# Patient Record
Sex: Female | Born: 1980
Health system: Southern US, Community
[De-identification: ages and names within clinical notes are randomized; demographics above are authoritative.]

## PROBLEM LIST (undated history)

## (undated) DIAGNOSIS — F419 Anxiety disorder, unspecified: Secondary | ICD-10-CM

## (undated) DIAGNOSIS — D649 Anemia, unspecified: Secondary | ICD-10-CM

## (undated) DIAGNOSIS — Z87442 Personal history of urinary calculi: Secondary | ICD-10-CM

## (undated) DIAGNOSIS — I10 Essential (primary) hypertension: Secondary | ICD-10-CM

## (undated) DIAGNOSIS — L309 Dermatitis, unspecified: Secondary | ICD-10-CM

## (undated) HISTORY — PX: TONSILLECTOMY: SUR1361

## (undated) HISTORY — PX: OTHER SURGICAL HISTORY: SHX169

## (undated) HISTORY — DX: Dermatitis, unspecified: L30.9

---

## 2003-02-20 ENCOUNTER — Emergency Department (HOSPITAL_COMMUNITY): Admission: EM | Admit: 2003-02-20 | Discharge: 2003-02-20 | Payer: Self-pay | Admitting: Emergency Medicine

## 2003-05-31 ENCOUNTER — Ambulatory Visit (HOSPITAL_COMMUNITY): Admission: RE | Admit: 2003-05-31 | Discharge: 2003-05-31 | Payer: Self-pay | Admitting: General Surgery

## 2007-02-07 ENCOUNTER — Inpatient Hospital Stay (HOSPITAL_COMMUNITY): Admission: AD | Admit: 2007-02-07 | Discharge: 2007-02-07 | Payer: Self-pay | Admitting: Obstetrics & Gynecology

## 2007-04-22 ENCOUNTER — Encounter: Admission: RE | Admit: 2007-04-22 | Discharge: 2007-07-08 | Payer: Self-pay | Admitting: Obstetrics and Gynecology

## 2007-05-14 ENCOUNTER — Ambulatory Visit (HOSPITAL_COMMUNITY): Admission: RE | Admit: 2007-05-14 | Discharge: 2007-05-14 | Payer: Self-pay | Admitting: Obstetrics and Gynecology

## 2007-06-09 ENCOUNTER — Ambulatory Visit (HOSPITAL_COMMUNITY): Admission: RE | Admit: 2007-06-09 | Discharge: 2007-06-09 | Payer: Self-pay | Admitting: Obstetrics and Gynecology

## 2007-07-07 ENCOUNTER — Ambulatory Visit (HOSPITAL_COMMUNITY): Admission: RE | Admit: 2007-07-07 | Discharge: 2007-07-07 | Payer: Self-pay | Admitting: Obstetrics and Gynecology

## 2007-07-18 ENCOUNTER — Inpatient Hospital Stay (HOSPITAL_COMMUNITY): Admission: AD | Admit: 2007-07-18 | Discharge: 2007-07-18 | Payer: Self-pay | Admitting: Obstetrics

## 2007-07-20 ENCOUNTER — Inpatient Hospital Stay (HOSPITAL_COMMUNITY): Admission: AD | Admit: 2007-07-20 | Discharge: 2007-07-20 | Payer: Self-pay | Admitting: *Deleted

## 2007-07-21 ENCOUNTER — Ambulatory Visit (HOSPITAL_COMMUNITY): Admission: RE | Admit: 2007-07-21 | Discharge: 2007-07-21 | Payer: Self-pay | Admitting: Obstetrics and Gynecology

## 2007-08-12 ENCOUNTER — Ambulatory Visit (HOSPITAL_COMMUNITY): Admission: RE | Admit: 2007-08-12 | Discharge: 2007-08-12 | Payer: Self-pay | Admitting: Obstetrics and Gynecology

## 2007-08-26 ENCOUNTER — Ambulatory Visit (HOSPITAL_COMMUNITY): Admission: RE | Admit: 2007-08-26 | Discharge: 2007-08-26 | Payer: Self-pay | Admitting: Obstetrics and Gynecology

## 2007-09-02 ENCOUNTER — Ambulatory Visit (HOSPITAL_COMMUNITY): Admission: RE | Admit: 2007-09-02 | Discharge: 2007-09-02 | Payer: Self-pay | Admitting: Obstetrics and Gynecology

## 2007-09-09 ENCOUNTER — Inpatient Hospital Stay (HOSPITAL_COMMUNITY): Admission: AD | Admit: 2007-09-09 | Discharge: 2007-09-14 | Payer: Self-pay | Admitting: Obstetrics and Gynecology

## 2007-09-09 ENCOUNTER — Encounter: Payer: Self-pay | Admitting: Obstetrics and Gynecology

## 2007-09-11 ENCOUNTER — Encounter (INDEPENDENT_AMBULATORY_CARE_PROVIDER_SITE_OTHER): Payer: Self-pay | Admitting: Obstetrics and Gynecology

## 2007-09-15 ENCOUNTER — Encounter: Admission: RE | Admit: 2007-09-15 | Discharge: 2007-10-12 | Payer: Self-pay | Admitting: Obstetrics and Gynecology

## 2007-10-11 ENCOUNTER — Emergency Department (HOSPITAL_COMMUNITY): Admission: EM | Admit: 2007-10-11 | Discharge: 2007-10-11 | Payer: Self-pay | Admitting: Emergency Medicine

## 2007-10-13 ENCOUNTER — Encounter: Admission: RE | Admit: 2007-10-13 | Discharge: 2007-11-12 | Payer: Self-pay | Admitting: Obstetrics and Gynecology

## 2007-11-13 ENCOUNTER — Encounter: Admission: RE | Admit: 2007-11-13 | Discharge: 2007-12-08 | Payer: Self-pay | Admitting: Obstetrics and Gynecology

## 2008-03-16 IMAGING — US US FETAL BPP W/O NONSTRESS
1 series · 14 of 28 positions shown · non-contrast
Comparison: none

OBSTETRICAL ULTRASOUND:
 This ultrasound was performed in The [HOSPITAL], and the [facility] report will be stored to [REDACTED] PACS.

[Series 1: us fetal bpp w/o nonstress · 14 of 28 slices shown]
[im 2/28]
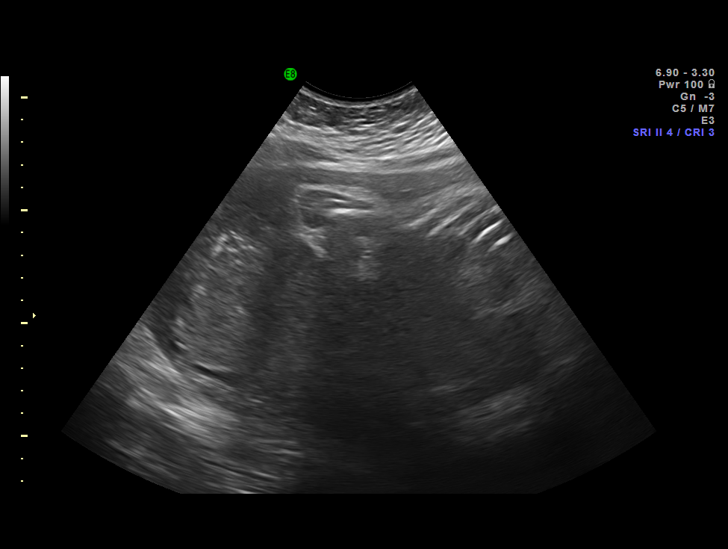
[im 4/28]
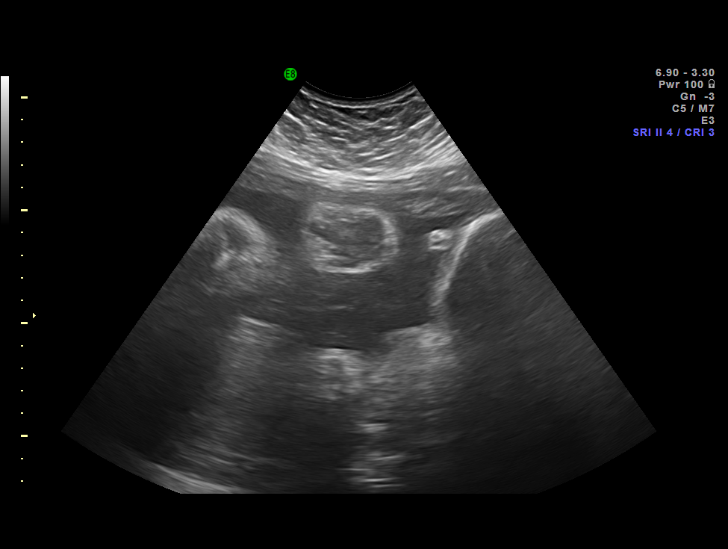
[im 6/28]
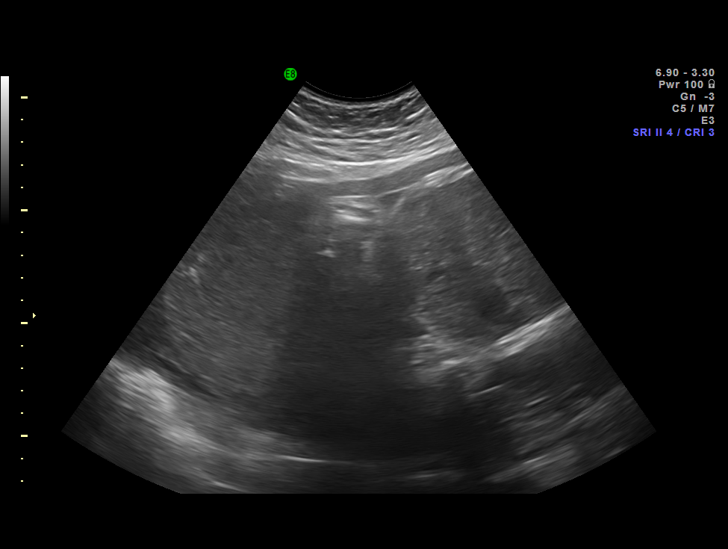
[im 8/28]
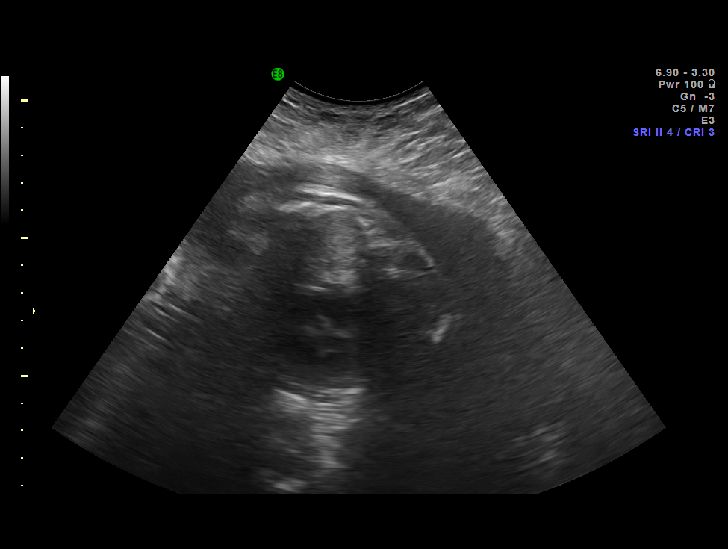
[im 10/28]
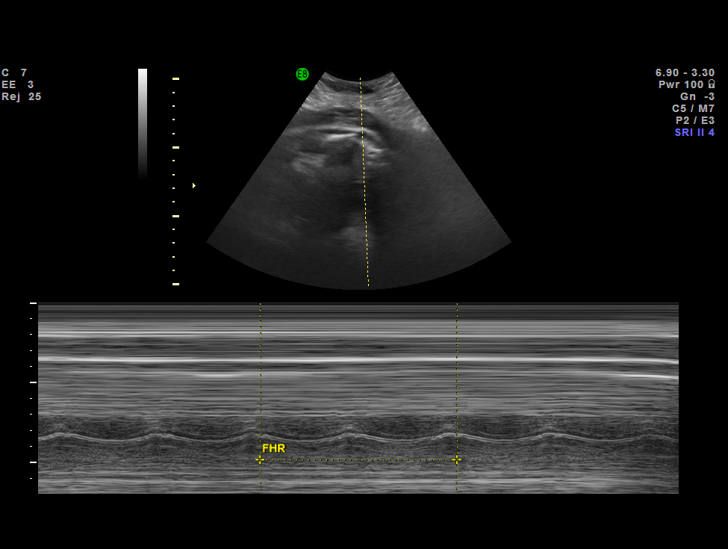
[im 12/28]
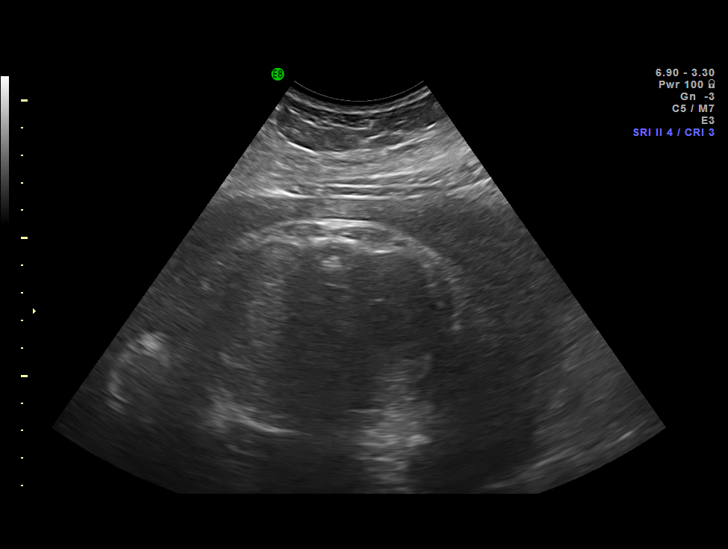
[im 14/28]
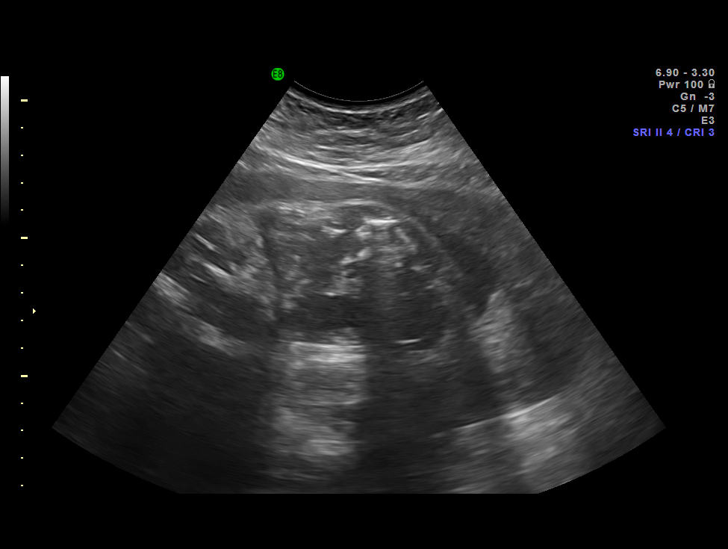
[im 16/28]
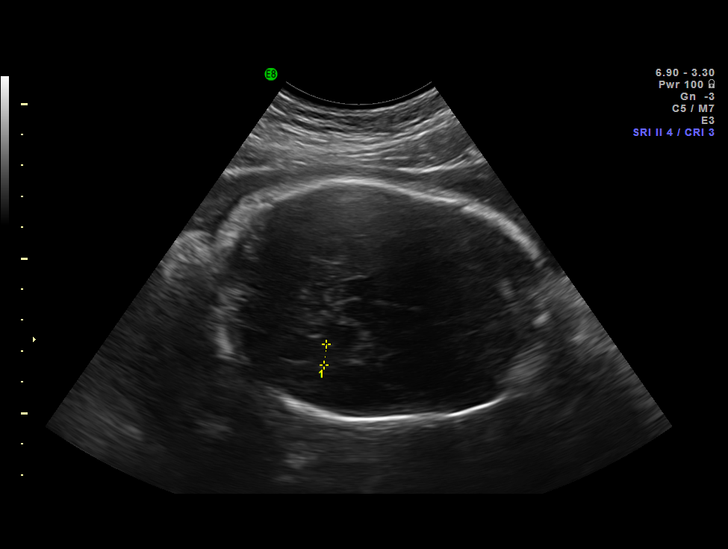
[im 18/28]
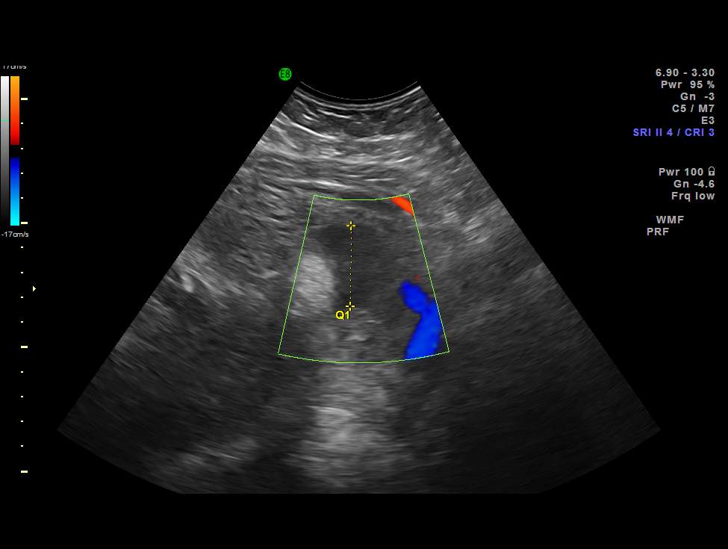
[im 20/28]
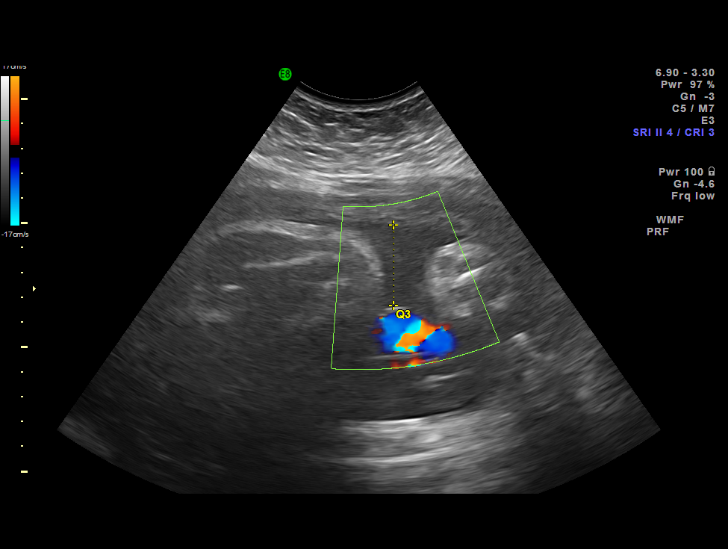
[im 22/28]
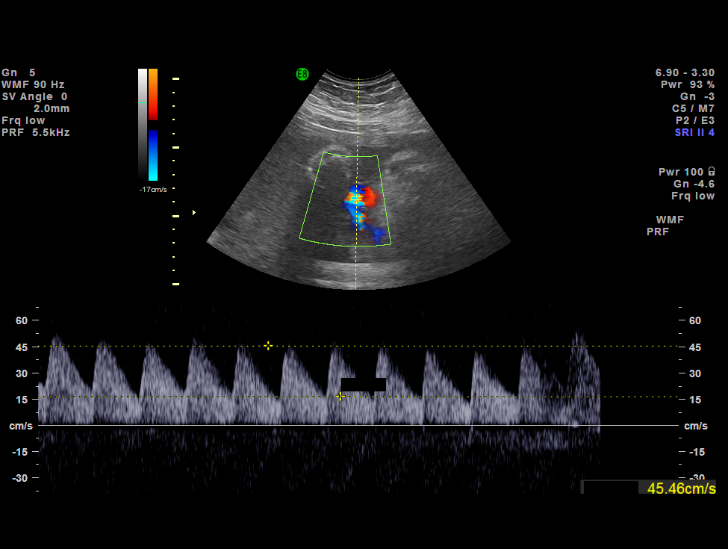
[im 24/28]
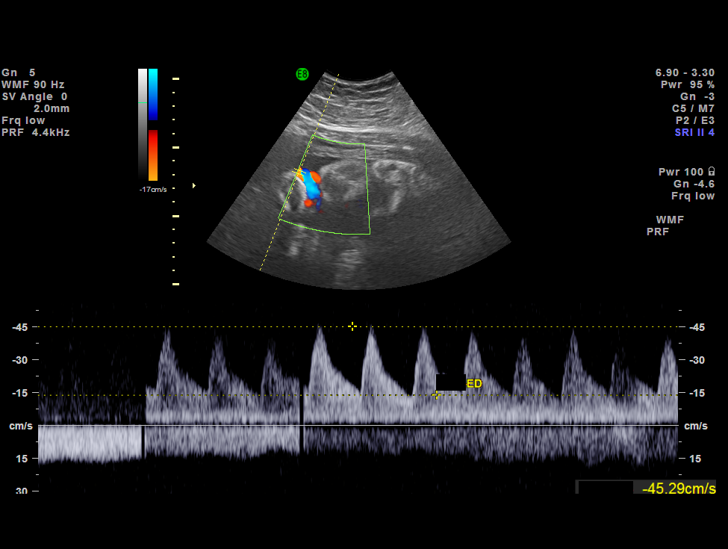
[im 26/28]
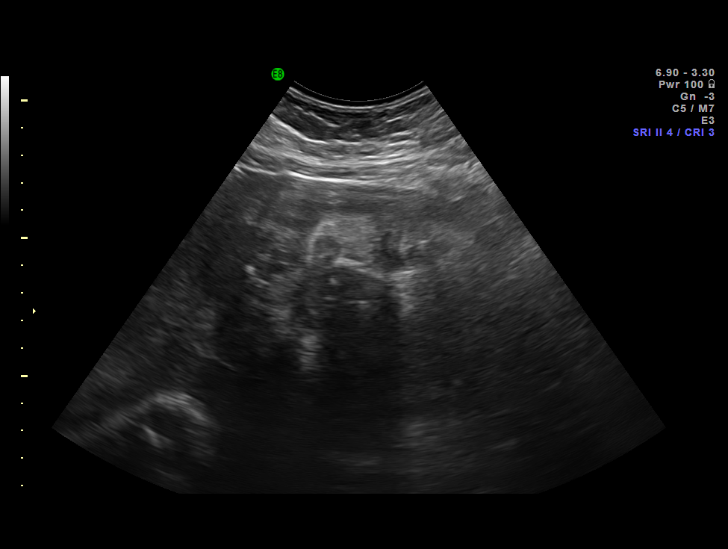
[im 28/28]
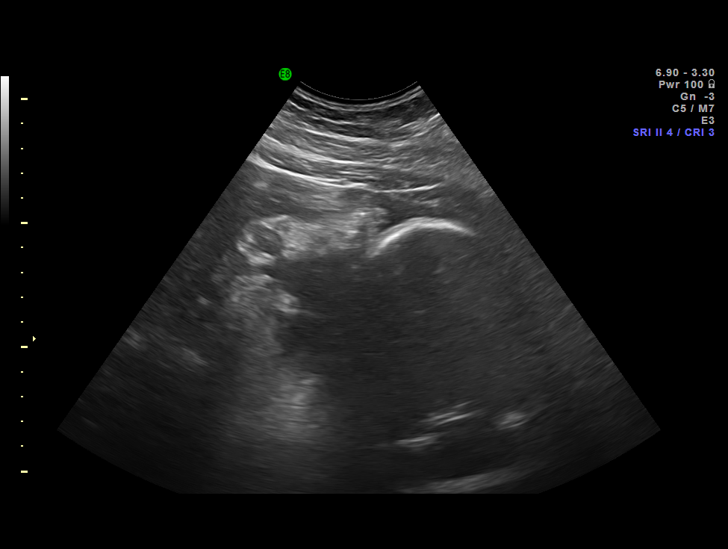

[14 of 28 positions shown; findings below may reference images not displayed]

IMPRESSION: The [facility] report has also been faxed to the ordering physician.

## 2008-07-29 ENCOUNTER — Emergency Department (HOSPITAL_COMMUNITY): Admission: EM | Admit: 2008-07-29 | Discharge: 2008-07-29 | Payer: Self-pay | Admitting: Emergency Medicine

## 2010-06-09 ENCOUNTER — Inpatient Hospital Stay (HOSPITAL_COMMUNITY): Admission: AD | Admit: 2010-06-09 | Discharge: 2010-06-09 | Payer: Self-pay | Admitting: Obstetrics and Gynecology

## 2010-07-10 ENCOUNTER — Inpatient Hospital Stay (HOSPITAL_COMMUNITY)
Admission: AD | Admit: 2010-07-10 | Discharge: 2010-07-11 | Payer: Self-pay | Source: Home / Self Care | Attending: Obstetrics and Gynecology | Admitting: Obstetrics and Gynecology

## 2010-07-16 ENCOUNTER — Inpatient Hospital Stay (HOSPITAL_COMMUNITY)
Admission: AD | Admit: 2010-07-16 | Discharge: 2010-07-19 | Payer: Self-pay | Source: Home / Self Care | Attending: Obstetrics and Gynecology | Admitting: Obstetrics and Gynecology

## 2010-10-08 LAB — COMPREHENSIVE METABOLIC PANEL
AST: 15 U/L (ref 0–37)
Albumin: 2.8 g/dL — ABNORMAL LOW (ref 3.5–5.2)
BUN: 7 mg/dL (ref 6–23)
BUN: 9 mg/dL (ref 6–23)
CO2: 22 mEq/L (ref 19–32)
CO2: 23 mEq/L (ref 19–32)
Chloride: 102 mEq/L (ref 96–112)
Chloride: 103 mEq/L (ref 96–112)
Creatinine, Ser: 0.64 mg/dL (ref 0.4–1.2)
Creatinine, Ser: 0.64 mg/dL (ref 0.4–1.2)
GFR calc non Af Amer: >60 mL/min (ref >60)
GFR calc non Af Amer: >60 mL/min (ref >60)
Glucose, Bld: 79 mg/dL (ref 70–99)
Total Bilirubin: 0.4 mg/dL (ref 0.3–1.2)
Total Bilirubin: 0.5 mg/dL (ref 0.3–1.2)

## 2010-10-08 LAB — CBC
Hemoglobin: 12 g/dL (ref 12.0–15.0)
MCH: 25 pg — ABNORMAL LOW (ref 26.0–34.0)
MCV: 77.7 fL — ABNORMAL LOW (ref 78.0–100.0)
MCV: 77.7 fL — ABNORMAL LOW (ref 78.0–100.0)
Platelets: 134 10*3/uL — ABNORMAL LOW (ref 150–400)
Platelets: 141 10*3/uL — ABNORMAL LOW (ref 150–400)
RBC: 4.84 MIL/uL (ref 3.87–5.11)
RBC: 5.11 MIL/uL (ref 3.87–5.11)
RDW: 14.8 % (ref 11.5–15.5)
WBC: 6.3 10*3/uL (ref 4.0–10.5)

## 2010-10-08 LAB — LACTATE DEHYDROGENASE: LDH: 159 U/L (ref 94–250)

## 2010-10-08 LAB — TYPE AND SCREEN: Antibody Screen: NEGATIVE

## 2010-10-08 LAB — RPR: RPR Ser Ql: NONREACTIVE

## 2010-10-08 LAB — URIC ACID: Uric Acid, Serum: 3.4 mg/dL (ref 2.4–7.0)

## 2010-10-09 LAB — RAPID URINE DRUG SCREEN, HOSP PERFORMED
Benzodiazepines: NOT DETECTED
Cocaine: NOT DETECTED
Opiates: NOT DETECTED

## 2010-10-09 LAB — URINE CULTURE: Colony Count: >=100000

## 2010-10-09 LAB — CBC
HCT: 38 % (ref 36.0–46.0)
MCV: 81.2 fL (ref 78.0–100.0)
RBC: 4.68 MIL/uL (ref 3.87–5.11)
WBC: 6.3 10*3/uL (ref 4.0–10.5)

## 2010-10-09 LAB — URINALYSIS, ROUTINE W REFLEX MICROSCOPIC
Glucose, UA: NEGATIVE mg/dL
Ketones, ur: 15 mg/dL — AB
Protein, ur: NEGATIVE mg/dL
Urobilinogen, UA: 0.2 mg/dL (ref 0.0–1.0)

## 2010-10-09 LAB — WET PREP, GENITAL

## 2010-10-09 LAB — STREP B DNA PROBE: Strep Group B Ag: NEGATIVE

## 2010-10-09 LAB — GC/CHLAMYDIA PROBE AMP, GENITAL: GC Probe Amp, Genital: NEGATIVE

## 2010-10-09 LAB — COMPREHENSIVE METABOLIC PANEL
Albumin: 2.8 g/dL — ABNORMAL LOW (ref 3.5–5.2)
Alkaline Phosphatase: 109 U/L (ref 39–117)
BUN: 13 mg/dL (ref 6–23)
Chloride: 104 mEq/L (ref 96–112)
Glucose, Bld: 96 mg/dL (ref 70–99)
Potassium: 4 mEq/L (ref 3.5–5.1)
Total Bilirubin: 0.2 mg/dL — ABNORMAL LOW (ref 0.3–1.2)

## 2010-10-09 LAB — URINE MICROSCOPIC-ADD ON

## 2010-10-09 LAB — LACTATE DEHYDROGENASE: LDH: 135 U/L (ref 94–250)

## 2010-12-11 NOTE — Op Note (Signed)
NAME:  Deborah Garza, Deborah Garza               ACCOUNT NO.:  [id number]   MEDICAL RECORD NO.:  [id number]          PATIENT TYPE:  INP   LOCATION:  9110                          FACILITY:  [facility]   PHYSICIAN:  Maxie Better, M.D.DATE OF BIRTH:  Feb 07, 1981   DATE OF PROCEDURE:  09/11/2007  DATE OF DISCHARGE:                               OPERATIVE REPORT   PREOPERATIVE DIAGNOSIS:  Nonreassuring fetal heart tracing, intrauterine  gestation at 37 plus weeks, intrauterine growth restriction, fibroid  uterus.   PROCEDURE:  Primary cesarean section with Sharl Ma hysterotomy.   POSTOPERATIVE DIAGNOSIS:  Nonreassuring fetal heart tracing secondary to  cord compression, intrauterine growth restriction, intrauterine  gestation at 37 plus weeks, fibroid uterus.   ANESTHESIA:  Epidural.   SURGEON:  Maxie Better, M.D.   ASSISTANT:  None.   INDICATIONS:  30 year old gravida 1, para 0, female admitted at 32 plus  weeks gestation for induction secondary to the intrauterine growth  restriction.  The patient had been followed by maternal fetal medicine  throughout her care and was found to have an estimated fetal weight of 4  pounds 14 ounces which was less than the 10th percentile.  She was group  B Strep culture positive.  The patient underwent Cervidil ripening,  Pitocin induction, artificial rupture of membranes, internal scalp  electrode, as well as intrauterine pressure catheters.  She ultimately  progressed to 4 cm, 100%, plus 1 station, and began having variable  decelerations which subsequently became persistent and bradycardic  resulting in the decision to proceed with a primary cesarean section as  the baby was not responsive to scalp stimulation and the amnionfusion  which had been started was not appearing to be effective.  The surgical  risks were reviewed.  The patient was transferred to the operating room  for primary cesarean section.   PROCEDURE IN DETAIL:  Under adequate epidural  anesthesia, the patient  was placed in the supine position with a left lateral tilt.  She was  sterilely prepped and draped in the usual fashion.  An indwelling Foley  catheter had been already in place.  Fetal heart rate on entering the  operating room had been 1202.  A Pfannenstiel skin incision was then  made and carried down to the rectus fascia.  The rectus fascia was  opened transversely.  The rectus fascia was then bluntly and sharply  dissected off the rectus muscle in a superior and inferior fashion.  The  rectus muscles were split in the midline.  The parietal peritoneum was  entered bluntly and extended. The vesicouterine peritoneum was opened  transversely. The bladder was displaced inferiorly.  A curvilinear low  transverse uterine incision was then made and extended with bandage  scissors.  Subsequent delivery of a live female from the left occiput  posterior position was accomplished.  The baby had a cord around the  neck and around the left arm.  The baby was subsequently bulb suctioned  on the abdomen, the cord was had been disentangled, clamped, cut and the  baby was transferred to the awaiting pediatrician who assigned Apgars of  8  and 9 at 1 and 5 minutes.  The placenta was manually removed and sent  to pathology.  The uterine cavity was cleaned of debris.  The uterine  incision had no extension and was closed in two layers, the first layer  a 0 Monocryl stitch, the second layer was imbricated using 0 Monocryl  sutures.  The serosal surface defect superior to the incision was closed  with 3-0 Monocryl suture. Normal tubes and ovaries were noted  bilaterally.  Large subserosal fibroids were noticed on both sides.  They were not removed.  The abdomen was then copiously irrigated and  suctioned of debris. The parietal peritoneum was then closed with 2-0  Vicryl, the rectus fascia was closed with 0 Vicryl x2, the subcutaneous  areas were irrigated, small bleeders cauterized,  and interrupted 2-0  plain sutures were placed, and the skin approximated with Ethicon  staples.  A cord pH was obtained but the quantity was not sufficient for  evaluation.   ESTIMATED BLOOD LOSS:  800 mL.   INTRAOPERATIVE FLUIDS:  1800 mL crystalloid.   URINE OUTPUT:  300 mL clear yellow urine.   COUNTS:  Sponge and instrument counts x2 was correct.   COMPLICATION:  None.   Weight of the baby was 4 pounds 14 ounces.  The baby was taken to the  central nursery. The mother, who was in stable condition, was  transferred to the recovery room.      Maxie Better, M.D.  Electronically Signed     Jasper/MEDQ  D:  09/11/2007  T:  09/12/2007  Job:  [id number]

## 2010-12-11 NOTE — Discharge Summary (Signed)
NAMEUNA, YEOMANS               ACCOUNT NO.:  [id number]   MEDICAL RECORD NO.:  [id number]          PATIENT TYPE:  INP   LOCATION:  9110                          FACILITY:  [facility]   PHYSICIAN:  Maxie Better, M.D.DATE OF BIRTH:  July 06, 1981   DATE OF ADMISSION:  09/09/2007  DATE OF DISCHARGE:  09/14/2007                               DISCHARGE SUMMARY   ADMISSION DIAGNOSES:  1. Intrauterine growth restriction.  2. Intrauterine gestation at 37+ weeks.  3. Uterine fibroids.   DISCHARGE DIAGNOSES:  1. Intrauterine gestation at 37+ weeks, delivered.  2. Intrauterine growth restriction.  3. Nonreassuring fetal heart tracing secondary to cord compression.  4. Fibroid uterus.   PROCEDURE:  A primary cesarean section, Kerr hysterotomy.   HISTORY OF PRESENT ILLNESS:  A 30 year old gravida 1, para 0, female at  60+ weeks' gestation admitted for induction of labor secondary to  intrauterine growth restriction.  The patient had been followed by  maternal-fetal medicine during her prenatal course.  She had an  ultrasound that showed an estimated fetal weight of 4 pounds 14 ounces,  which was less than the 10th percentile.  Biophysical profile was 8/8  and normal fluid.  Recommendation by maternal-fetal medicine was to  proceed with induction of labor.  The cervical exam was 1, 50%, -2.  The  .patient was known to be group B strep culture positive   HOSPITAL COURSE:  The patient was admitted to [facility].  The  cervix was 1,50%, -2.  She had a reactive tracing, no contractions.  Cervidil was placed for further ripening of her cervix.  The patient  subsequently had the Cervidil removed.  Pitocin induction was started.  Group B penicillin prophylaxis was started due to her group B strep  being positive.  The patient subsequently had artificial rupture of  membranes, clear fluid noted.  Internal pressure catheter, internal  scalp electrodes were placed.  She began having variable  decelerations  down to the 100's x30-40 seconds, baseline fetal heart rate of 150.  The  tracing resulted in a decision to do the artificial rupture of  membranes.  She was contracting every 1 to 1-1/2 minutes.  Pitocin was  decreased.  The patient continued to have variable decelerations.  She  had an episode of tetanic contraction.  Pitocin was discontinued.  A  fluid bolus was given and maternal oxygenation was given.  The tracing  became reassuring again and Pitocin was restarted.  Amnioinfusion was  also started for variable decelerations.  The fetal heart rate had  subsequently decreased on February 13 to the 70s.  She was 4, 100%, +1  station.  No scalp stimulation response was noted.  Therefore, a  decision was made to proceed with a primary cesarean section.  Pitocin  had been off, maternal oxygenation and positional changes had already  been done.  The patient was taken to the operating room.  A primary  cesarean section was performed.  The baby was a live female, Apgars of 8  and 9.  There was a cord around his neck and left arm and he  was in the  left occiput posterior position.  Insufficient specimens obtained for  cord pH; therefore, it was not done.  Normal tubes and ovaries were  noted.  A fundal subserosal fibroid was seen.  The placenta was sent to  pathology given the indication for her delivery.  It was a premature  placenta but otherwise unremarkable.  The patient had an uncomplicated  postoperative course.  Her CBC on postop day #1 showed a hemoglobin 9.4,  hematocrit of 28.1, platelet count 129,000, white count of 8.8.  By  postop day #3 the patient, who had a nonproductive cough, was otherwise  afebrile.  Lungs were clear.  She had no evidence of infection and had  tolerated a regular diet.  The incision had no evidence of erythema,  induration or exudate.  She was deemed well to be discharged home.   DISPOSITION:  Home.   CONDITION:  Stable.   DISCHARGE  MEDICATIONS:  1. Tessalon Perles 100 mg p.o. t.i.d. for 10 days.  2. Motrin 800 mg one p.o. q.6 h. p.r.n. pain.  3. Prenatal vitamins one p.o. daily.  4. Iron supplementation 325 mg p.o. b.i.d.  5. Stool softener 100 mg p.o. b.i.d.   FOLLOW-UP APPOINTMENT:  At [facility] at 6 weeks and for staple  removal on September 16, 2007.   Discharge instructions per the postpartum booklet given.      Maxie Better, M.D.  Electronically Signed     [place]/MEDQ  D:  10/05/2007  T:  10/06/2007  Job:  [id number]

## 2010-12-14 NOTE — H&P (Signed)
   NAME:  Deborah Garza, Deborah Garza                         ACCOUNT NO.:  [id number]   MEDICAL RECORD NO.:  [id number]                   PATIENT TYPE:   LOCATION:                                       FACILITY:  [facility]   PHYSICIAN:  Dirk Dress. Katrinka Blazing, M.D.                DATE OF BIRTH:  21-Sep-1980   DATE OF ADMISSION:  DATE OF DISCHARGE:                                HISTORY & PHYSICAL   HISTORY OF PRESENT ILLNESS:  [age]-year-old female with a history of  swelling in the perianal area for about four days prior to admission.  She  had purulent drainage x2 days.  The pain became much worse and she was seen  in the office, where she was noted to have a perirectal abscess.  She is  scheduled to have this excised or incised and drained.   PAST HISTORY:  She has obesity and mild hypertension.  She has no other  medical illness.   MEDICATIONS:  Hydrochlorothiazide 25 mg daily.   PHYSICAL EXAMINATION:  VITAL SIGNS:  On exam, blood pressure 124/78, pulse  80, respirations 20, weight 250 pounds.  HEENT:  Unremarkable.  NECK:  Neck is supple without JVD or bruit.  CHEST:  Chest clear to auscultation.  HEART:  Regular rate and rhythm without murmur, gallop or rub.  ABDOMEN:  Abdomen is soft, nontender.  No masses.  RECTAL:  Indurated area of the right perirectal space with small puncture  site which is not draining.  This extends into the anal canal.  A good  digital examination could not be done because of pain.  There is surrounding  induration.  EXTREMITIES:  No cyanosis, clubbing or edema.  NEUROLOGIC:  No focal motor, sensory or cerebellar deficit.   IMPRESSION:  1. Perirectal abscess.  2. Mild hypertension.   PLAN:  Scheduled for excision or incision and drainage of perirectal  abscess.     ___________________________________________                                         Dirk Dress Katrinka Blazing, M.D.   LCS/MEDQ  D:  05/31/2003  T:  05/31/2003  Job:  [id number]

## 2011-04-19 ENCOUNTER — Emergency Department (HOSPITAL_COMMUNITY)
Admission: EM | Admit: 2011-04-19 | Discharge: 2011-04-19 | Disposition: A | Discharge status: Home / Self Care | Payer: BC Managed Care – PPO | Attending: Emergency Medicine | Admitting: Emergency Medicine

## 2011-04-19 DIAGNOSIS — M79609 Pain in unspecified limb: Secondary | ICD-10-CM | POA: Insufficient documentation

## 2011-04-19 DIAGNOSIS — I10 Essential (primary) hypertension: Secondary | ICD-10-CM | POA: Insufficient documentation

## 2011-04-19 DIAGNOSIS — M5430 Sciatica, unspecified side: Secondary | ICD-10-CM | POA: Insufficient documentation

## 2011-04-19 DIAGNOSIS — IMO0001 Reserved for inherently not codable concepts without codable children: Secondary | ICD-10-CM | POA: Insufficient documentation

## 2011-04-19 DIAGNOSIS — M545 Low back pain, unspecified: Secondary | ICD-10-CM | POA: Insufficient documentation

## 2011-04-19 LAB — CBC
HCT: 28.1 — ABNORMAL LOW
Hemoglobin: 12.2
Hemoglobin: 9.4 — ABNORMAL LOW
MCV: 79.6
MCV: 78.6
RBC: 4.69
RDW: 16.2 — ABNORMAL HIGH
WBC: 5.9

## 2011-04-19 LAB — URINE MICROSCOPIC-ADD ON

## 2011-04-19 LAB — URINALYSIS, ROUTINE W REFLEX MICROSCOPIC
Bilirubin Urine: NEGATIVE
Ketones, ur: NEGATIVE mg/dL
Nitrite: NEGATIVE
Protein, ur: NEGATIVE mg/dL
Urobilinogen, UA: 1 mg/dL (ref 0.0–1.0)
pH: 6.5 (ref 5.0–8.0)

## 2011-04-19 LAB — POCT PREGNANCY, URINE: Preg Test, Ur: NEGATIVE

## 2011-04-19 LAB — RPR: RPR Ser Ql: NONREACTIVE

## 2011-05-03 LAB — URINALYSIS, ROUTINE W REFLEX MICROSCOPIC
Bilirubin Urine: NEGATIVE
Hgb urine dipstick: NEGATIVE
Specific Gravity, Urine: 1.02
Urobilinogen, UA: 0.2
pH: 7

## 2011-05-14 LAB — URINALYSIS, ROUTINE W REFLEX MICROSCOPIC
Glucose, UA: NEGATIVE
pH: 6

## 2011-11-06 ENCOUNTER — Telehealth: Payer: Self-pay

## 2011-11-06 ENCOUNTER — Other Ambulatory Visit: Payer: Self-pay

## 2011-11-06 NOTE — Telephone Encounter (Signed)
Spoke with pt rgd msg pt has appt for aex 12/05/11 want rx for bc pt states not taking bc x77months advised pt unable to refil rx at this time per protocol will get new rx at next visit pt voice understanding. Rolla Plate

## 2011-12-04 ENCOUNTER — Ambulatory Visit (INDEPENDENT_AMBULATORY_CARE_PROVIDER_SITE_OTHER): Payer: BC Managed Care – PPO | Admitting: Obstetrics and Gynecology

## 2011-12-04 ENCOUNTER — Encounter: Payer: Self-pay | Admitting: Obstetrics and Gynecology

## 2011-12-04 VITALS — BP 138/98 | Resp 16 | Ht 65.5 in | Wt 270.0 lb

## 2011-12-04 DIAGNOSIS — Z124 Encounter for screening for malignant neoplasm of cervix: Secondary | ICD-10-CM

## 2011-12-04 DIAGNOSIS — Z309 Encounter for contraceptive management, unspecified: Secondary | ICD-10-CM

## 2011-12-04 DIAGNOSIS — D219 Benign neoplasm of connective and other soft tissue, unspecified: Secondary | ICD-10-CM

## 2011-12-04 DIAGNOSIS — E669 Obesity, unspecified: Secondary | ICD-10-CM

## 2011-12-04 DIAGNOSIS — Z139 Encounter for screening, unspecified: Secondary | ICD-10-CM

## 2011-12-04 DIAGNOSIS — Z01419 Encounter for gynecological examination (general) (routine) without abnormal findings: Secondary | ICD-10-CM

## 2011-12-04 DIAGNOSIS — D259 Leiomyoma of uterus, unspecified: Secondary | ICD-10-CM

## 2011-12-04 NOTE — Patient Instructions (Signed)
Please give Mirena pamphlet.

## 2011-12-04 NOTE — Progress Notes (Signed)
Contraception condoms Last pap 2011 Last Mammo never Last Colonoscopy never Last Dexa Scan never Primary MD Dr. Mirna Mires Abuse: No  No complaints.  Wants BC method.  Used to be on progesterone only pills.  Filed Vitals:   12/04/11 1539  BP: 138/98  Resp: 16   ROS: noncontributory  Physical Examination: General appearance - alert, well appearing, and in no distress Neck - supple, no significant adenopathy Chest - clear to auscultation, no wheezes, rales or rhonchi, symmetric air entry Heart - normal rate and regular rhythm Abdomen - soft, nontender, nondistended, no masses or organomegaly Breasts - breasts appear normal, no suspicious masses, no skin or nipple changes or axillary nodes Pelvic - normal external genitalia, vulva, vagina, cervix, uterus 12wk size and adnexa, +menses Back exam - no CVAT Extremities - no edema, redness or tenderness in the calves or thighs  A/P Labs - TSH, PRL, CBC, vit D Pap GC/CT pre IUD Counseled re: contraception - plans Mirena in 4wks  U/S in 4wks to reeval fibroids

## 2011-12-05 LAB — CBC
HCT: 42.1 % (ref 36.0–46.0)
MCHC: 30.2 g/dL (ref 30.0–36.0)
MCV: 79 fL (ref 78.0–100.0)
RDW: 14.8 % (ref 11.5–15.5)

## 2011-12-06 ENCOUNTER — Telehealth: Payer: Self-pay | Admitting: Obstetrics and Gynecology

## 2011-12-06 LAB — PAP IG, CT-NG, RFX HPV ASCU: Chlamydia Probe Amp: NEGATIVE

## 2011-12-06 NOTE — Telephone Encounter (Signed)
Jackie/epic/lab res

## 2011-12-09 ENCOUNTER — Emergency Department (HOSPITAL_COMMUNITY)
Admission: EM | Admit: 2011-12-09 | Discharge: 2011-12-10 | Disposition: A | Discharge status: Home / Self Care | Payer: BC Managed Care – PPO | Attending: Emergency Medicine | Admitting: Emergency Medicine

## 2011-12-09 ENCOUNTER — Encounter (HOSPITAL_COMMUNITY): Payer: Self-pay | Admitting: *Deleted

## 2011-12-09 DIAGNOSIS — I10 Essential (primary) hypertension: Secondary | ICD-10-CM | POA: Insufficient documentation

## 2011-12-09 DIAGNOSIS — R079 Chest pain, unspecified: Secondary | ICD-10-CM | POA: Insufficient documentation

## 2011-12-09 HISTORY — DX: Essential (primary) hypertension: I10

## 2011-12-09 NOTE — Telephone Encounter (Signed)
This is AR pt 

## 2011-12-09 NOTE — ED Provider Notes (Signed)
History  This chart was scribed for [employer]. Colon Branch, MD by Cherlynn Perches. The patient was seen in room APA11/APA11. Patient's care was started at 2241.  CSN: [id number]  Arrival date & time 12/09/11  2241   First MD Initiated Contact with Patient 12/09/11 2311      Chief Complaint  Patient presents with  . Chest Pain    (Consider location/radiation/quality/duration/timing/severity/associated sxs/prior treatment) HPI  Deborah Garza is a 31 y.o. female with a h/o HTN who presents to the Emergency Department complaining of 1 day of sudden onset, unchanged, intermittent chest pain localized to the left chest and described as pressure. Pt states that symptoms began after eating a piece of pizza for dinner. Pt reports that pain is relieved by burping, but always comes back. Pt states that breathing deeply makes pain worse. Pt denies nausea, diaphoresis, and SOB. Pt denies smoking and reports using alcohol.   Past Medical History  Diagnosis Date  . Hypertension     Past Surgical History  Procedure Date  . Cesarean section   . Tonsillectomy     History reviewed. No pertinent family history.  History  Substance Use Topics  . Smoking status: Never Smoker   . Smokeless tobacco: Not on file  . Alcohol Use: Yes    OB History    Grav Para Term Preterm Abortions TAB SAB Ect Mult Living                  Review of Systems  Constitutional: Negative for chills, diaphoresis and fatigue.  HENT: Negative for congestion, sinus pressure and ear discharge.   Eyes: Negative for discharge.  Respiratory: Negative for cough and shortness of breath.   Cardiovascular: Positive for chest pain.  Gastrointestinal: Negative for nausea, abdominal pain and diarrhea.  Genitourinary: Negative for frequency and hematuria.  Musculoskeletal: Negative for back pain.  Skin: Negative for rash.  Neurological: Negative for seizures and headaches.  Hematological: Negative.   Psychiatric/Behavioral:  Negative for hallucinations.  All other systems reviewed and are negative.    Allergies  Aspirin  Home Medications   Current Outpatient Rx  Name Route Sig Dispense Refill  . HYDROCHLOROTHIAZIDE 25 MG PO TABS Oral Take 25 mg by mouth daily.      Triage Vitals: BP 165/95  Pulse 78  Temp(Src) 98.1 F (36.7 C) (Oral)  Resp 20  Ht 5\' 6"  (1.676 m)  Wt 260 lb (117.935 kg)  BMI 41.97 kg/m2  SpO2 100%  LMP 12/03/2011  Physical Exam  Nursing note and vitals reviewed. Constitutional: She is oriented to person, place, and time. She appears well-developed and well-nourished.  HENT:  Head: Normocephalic and atraumatic.  Eyes: Conjunctivae and EOM are normal. Pupils are equal, round, and reactive to light. No scleral icterus.  Neck: Normal range of motion. Neck supple.  Cardiovascular: Normal rate, regular rhythm and normal heart sounds.  Exam reveals no gallop and no friction rub.   No murmur heard. Pulmonary/Chest: Effort normal and breath sounds normal. No respiratory distress. She exhibits no tenderness (tenderness not reproducable).  Abdominal: Soft. Bowel sounds are normal. She exhibits no distension. There is no tenderness.  Musculoskeletal: Normal range of motion. She exhibits no edema.  Neurological: She is alert and oriented to person, place, and time. Coordination normal.  Skin: Skin is warm and dry.  Psychiatric: She has a normal mood and affect. Her behavior is normal.    ED Course  Procedures (including critical care time)  DIAGNOSTIC STUDIES: Oxygen Saturation  is 100% on room air, normal by my interpretation.    COORDINATION OF CARE: 11:56PM - discussed labs and EKG with pt. She agrees with plan. Results for orders placed during the hospital encounter of 12/09/11  CBC      Component Value Range   WBC 6.2  4.0 - 10.5 (K/uL)   RBC 5.08  3.87 - 5.11 (MIL/uL)   Hemoglobin 12.4  12.0 - 15.0 (g/dL)   HCT 16.1  09.6 - 04.5 (%)   MCV 77.0 (*) 78.0 - 100.0 (fL)   MCH  24.4 (*) 26.0 - 34.0 (pg)   MCHC 31.7  30.0 - 36.0 (g/dL)   RDW 40.9  81.1 - 91.4 (%)   Platelets 202  150 - 400 (K/uL)  BASIC METABOLIC PANEL      Component Value Range   Sodium 137  135 - 145 (mEq/L)   Potassium 3.5  3.5 - 5.1 (mEq/L)   Chloride 102  96 - 112 (mEq/L)   CO2 25  19 - 32 (mEq/L)   Glucose, Bld 113 (*) 70 - 99 (mg/dL)   BUN 15  6 - 23 (mg/dL)   Creatinine, Ser 7.82  0.50 - 1.10 (mg/dL)   Calcium 8.9  8.4 - 95.6 (mg/dL)   GFR calc non Af Amer >90  >90 (mL/min)   GFR calc Af Amer >90  >90 (mL/min)  TROPONIN I      Component Value Range   Troponin I <0.30  <0.30 (ng/mL)  URINALYSIS, ROUTINE W REFLEX MICROSCOPIC      Component Value Range   Color, Urine YELLOW  YELLOW    APPearance CLEAR  CLEAR    Specific Gravity, Urine 1.025  1.005 - 1.030    pH 6.0  5.0 - 8.0    Glucose, UA NEGATIVE  NEGATIVE (mg/dL)   Hgb urine dipstick NEGATIVE  NEGATIVE    Bilirubin Urine NEGATIVE  NEGATIVE    Ketones, ur NEGATIVE  NEGATIVE (mg/dL)   Protein, ur NEGATIVE  NEGATIVE (mg/dL)   Urobilinogen, UA 0.2  0.0 - 1.0 (mg/dL)   Nitrite NEGATIVE  NEGATIVE    Leukocytes, UA NEGATIVE  NEGATIVE   Dg Chest 2 View  12/10/2011  *RADIOLOGY REPORT*  Clinical Data: Chest pain since yesterday.  CHEST - 2 VIEW  Comparison: None.  Findings: Mild cardiac enlargement with normal pulmonary vascularity.  No focal airspace consolidation in the lungs.  No blunting of costophrenic angles.  No pneumothorax.  IMPRESSION: Mild cardiac enlargement.  No evidence of active pulmonary disease.  Original Report Authenticated By: Marlon Pel, M.D.    . Date: 12/10/2011  2301  Rate: 68  Rhythm: normal sinus rhythm  QRS Axis: normal  Intervals: normal  ST/T Wave abnormalities: normal  Conduction Disutrbances: none  Narrative Interpretation: unremarkable       MDM  Patient with left intermittent chest pain onset yesterday after eating pizza. She's had no response to TUMS. Given mouth PPI and H2 blocker  while here with relief. Labs are unremarkable ,chest x-ray is normal, EKG is normal.  Pt feels improved after observation and/or treatment in ED.Pt stable in ED with no significant deterioration in condition.The patient appears reasonably screened and/or stabilized for discharge and I doubt any other medical condition or other [facility] requiring further screening, evaluation, or treatment in the ED at this time prior to discharge.  I personally performed the services described in this documentation, which was scribed in my presence. The recorded information has been reviewed and considered.  MDM Reviewed: nursing note and vitals Interpretation: labs, ECG and x-ray           Nicoletta Dress. Colon Branch, MD 12/10/11 [phone]

## 2011-12-09 NOTE — Telephone Encounter (Signed)
TRIAGE/EPIC °

## 2011-12-09 NOTE — ED Notes (Signed)
Lt chest pain intermittently, , onset yesterday after eating pizza,  "pressure" no nausea, no sob,

## 2011-12-10 ENCOUNTER — Telehealth: Payer: Self-pay

## 2011-12-10 ENCOUNTER — Emergency Department (HOSPITAL_COMMUNITY): Payer: BC Managed Care – PPO

## 2011-12-10 LAB — URINALYSIS, ROUTINE W REFLEX MICROSCOPIC
Bilirubin Urine: NEGATIVE
Glucose, UA: NEGATIVE mg/dL
Hgb urine dipstick: NEGATIVE
Specific Gravity, Urine: 1.025 (ref 1.005–1.030)
pH: 6 (ref 5.0–8.0)

## 2011-12-10 LAB — CBC
HCT: 39.1 % (ref 36.0–46.0)
Hemoglobin: 12.4 g/dL (ref 12.0–15.0)
MCV: 77 fL — ABNORMAL LOW (ref 78.0–100.0)
RBC: 5.08 MIL/uL (ref 3.87–5.11)
RDW: 14.3 % (ref 11.5–15.5)
WBC: 6.2 10*3/uL (ref 4.0–10.5)

## 2011-12-10 LAB — BASIC METABOLIC PANEL
BUN: 15 mg/dL (ref 6–23)
CO2: 25 mEq/L (ref 19–32)
Chloride: 102 mEq/L (ref 96–112)
Creatinine, Ser: 0.74 mg/dL (ref 0.50–1.10)
Glucose, Bld: 113 mg/dL — ABNORMAL HIGH (ref 70–99)
Potassium: 3.5 mEq/L (ref 3.5–5.1)

## 2011-12-10 MED ORDER — PANTOPRAZOLE SODIUM 40 MG IV SOLR
40.0000 mg | Freq: Once | INTRAVENOUS | Status: AC
  Administered 2011-12-10: 40 mg via INTRAVENOUS
  Filled 2011-12-10: qty 40

## 2011-12-10 MED ORDER — FAMOTIDINE IN NACL 20-0.9 MG/50ML-% IV SOLN
20.0000 mg | Freq: Once | INTRAVENOUS | Status: AC
  Administered 2011-12-10: 20 mg via INTRAVENOUS
  Filled 2011-12-10: qty 50

## 2011-12-10 NOTE — Telephone Encounter (Signed)
Spoke to pt to give pap and cx culture results. Labs have not been reviewed by Dr. Su Hilt, yet, but I will call pt as soon as I have rec'd them after AR has seen them. Melody Comas A

## 2011-12-10 NOTE — Discharge Instructions (Signed)
Your blood work including your heart numbers were normal here tonight. Your chest x-ray and EKG were normal. The most likely cause of your pain is your stomach. Use Pepcid AC twice a day for the next 10 days. For the next 3 days take a small amount of Mylanta before each meal and at bedtime. If the combination of these medicines do not improve your symptoms, return to the emergency for reevaluation or followup with your doctor.

## 2011-12-12 ENCOUNTER — Telehealth: Payer: Self-pay

## 2011-12-12 NOTE — Telephone Encounter (Signed)
Called pt to recommend Vit D protocol, but pt states her PCP Dr. Mirna Mires is following that. He has her on Vit D protocol twice weekly x 3 months and has plans to recheck at that time as well. Melody Comas A

## 2012-01-06 ENCOUNTER — Ambulatory Visit (INDEPENDENT_AMBULATORY_CARE_PROVIDER_SITE_OTHER): Payer: BC Managed Care – PPO | Admitting: Obstetrics and Gynecology

## 2012-01-06 ENCOUNTER — Ambulatory Visit (INDEPENDENT_AMBULATORY_CARE_PROVIDER_SITE_OTHER): Payer: BC Managed Care – PPO

## 2012-01-06 ENCOUNTER — Encounter: Payer: Self-pay | Admitting: Obstetrics and Gynecology

## 2012-01-06 ENCOUNTER — Other Ambulatory Visit: Payer: Self-pay | Admitting: Obstetrics and Gynecology

## 2012-01-06 VITALS — BP 124/90 | Resp 18 | Ht 65.0 in | Wt 261.0 lb

## 2012-01-06 DIAGNOSIS — Z3009 Encounter for other general counseling and advice on contraception: Secondary | ICD-10-CM

## 2012-01-06 DIAGNOSIS — Z309 Encounter for contraceptive management, unspecified: Secondary | ICD-10-CM

## 2012-01-06 DIAGNOSIS — D219 Benign neoplasm of connective and other soft tissue, unspecified: Secondary | ICD-10-CM

## 2012-01-06 DIAGNOSIS — Z30431 Encounter for routine checking of intrauterine contraceptive device: Secondary | ICD-10-CM

## 2012-01-06 DIAGNOSIS — D259 Leiomyoma of uterus, unspecified: Secondary | ICD-10-CM

## 2012-01-06 DIAGNOSIS — Z30430 Encounter for insertion of intrauterine contraceptive device: Secondary | ICD-10-CM

## 2012-01-06 DIAGNOSIS — Z975 Presence of (intrauterine) contraceptive device: Secondary | ICD-10-CM

## 2012-01-06 LAB — POCT URINE PREGNANCY: Preg Test, Ur: NEGATIVE

## 2012-01-06 MED ORDER — LEVONORGESTREL 20 MCG/24HR IU IUD: INTRAUTERINE_SYSTEM | Freq: Once | INTRAUTERINE | Status: DC

## 2012-01-06 MED ORDER — LEVONORGESTREL 20 MCG/24HR IU IUD
INTRAUTERINE_SYSTEM | Freq: Once | INTRAUTERINE | Status: AC
  Administered 2012-01-06: 1 via INTRAUTERINE

## 2012-01-06 NOTE — Progress Notes (Signed)
Patient ID: Deborah Garza, female   DOB: [birth year], 31 y.o.   MRN: [id number] IUD INSERTION NOTE  Patient is here for IUD placement and followup ultrasound: Ultrasound today uterus 10.5 x 6.9 x 6.2 cm, normal bilateral ovaries, multiple fibroids - 4 are greater than 1 cm with the largest 3.5 cm.  There are several more that are less than 1 cm. TSH wnl, CBC wnl, vit D low  Consent signed after risks and benefits were reviewed including but not limited to bleeding, infection and risk of uterine perforation.  LMP: Patient's last menstrual period was 12/28/2011. UPT: negative GC / Chlamydia: negative 11/2011  MIRENA SERIAL NUMBER: [id number] exp 03/2014  Prepping with Betadine Tenaculum placed on anterior lip of cervix Uterus sounded at  10 cm Insertion of MIRENA IUD per protocol without any complications  POST-PROCEDURE:  Patient instructed to call with fever or excessive pain Patient instructed to check IUD strings after each menstrual cycle   Follow-up: 5 weeks   Levin Erp MD 01/06/2012 4:32 PM

## 2012-02-10 ENCOUNTER — Encounter: Payer: Self-pay | Admitting: Obstetrics and Gynecology

## 2012-02-10 ENCOUNTER — Ambulatory Visit (INDEPENDENT_AMBULATORY_CARE_PROVIDER_SITE_OTHER): Payer: BC Managed Care – PPO | Admitting: Obstetrics and Gynecology

## 2012-02-10 VITALS — BP 100/70 | Ht 65.0 in | Wt 267.0 lb

## 2012-02-10 DIAGNOSIS — Z30431 Encounter for routine checking of intrauterine contraceptive device: Secondary | ICD-10-CM

## 2012-02-10 DIAGNOSIS — Z309 Encounter for contraceptive management, unspecified: Secondary | ICD-10-CM

## 2012-02-10 MED ORDER — CYCLOBENZAPRINE HCL 10 MG PO TABS: 10.0000 mg | ORAL_TABLET | Freq: Three times a day (TID) | ORAL | Status: DC | PRN

## 2012-02-10 NOTE — Progress Notes (Signed)
C/o recurrence of her back pain.  Was relieved with flexeril.  Pt requesting refill.  Initially txd by primary MD.  C/o brownish d/c/ spotting last 2wks.  Filed Vitals:   02/10/12 1154  BP: 100/70   ROS: noncontributory  Pelvic exam:  VULVA: normal appearing vulva with no masses, tenderness or lesions,  VAGINA: normal appearing vagina with normal color and discharge, no lesions, CERVIX: normal appearing cervix without discharge or lesions, strings visible UTERUS: uterus is normal size, shape, consistency and nontender,  ADNEXA: normal adnexa in size, nontender and no masses.  A/P Flexeril AEX May 2014 Mirena SE reviewed (Pt does not think she is pregnant)

## 2012-07-08 ENCOUNTER — Encounter (HOSPITAL_COMMUNITY): Payer: Self-pay | Admitting: Emergency Medicine

## 2012-07-08 ENCOUNTER — Emergency Department (HOSPITAL_COMMUNITY)
Admission: EM | Admit: 2012-07-08 | Discharge: 2012-07-08 | Disposition: A | Discharge status: Home / Self Care | Payer: BC Managed Care – PPO | Attending: Emergency Medicine | Admitting: Emergency Medicine

## 2012-07-08 DIAGNOSIS — F419 Anxiety disorder, unspecified: Secondary | ICD-10-CM

## 2012-07-08 DIAGNOSIS — I10 Essential (primary) hypertension: Secondary | ICD-10-CM | POA: Insufficient documentation

## 2012-07-08 DIAGNOSIS — F411 Generalized anxiety disorder: Secondary | ICD-10-CM | POA: Insufficient documentation

## 2012-07-08 DIAGNOSIS — R110 Nausea: Secondary | ICD-10-CM | POA: Insufficient documentation

## 2012-07-08 DIAGNOSIS — Z79899 Other long term (current) drug therapy: Secondary | ICD-10-CM | POA: Insufficient documentation

## 2012-07-08 LAB — CBC WITH DIFFERENTIAL/PLATELET
Basophils Absolute: 0 10*3/uL (ref 0.0–0.1)
Eosinophils Relative: 3 % (ref 0–5)
HCT: 42.2 % (ref 36.0–46.0)
Lymphocytes Relative: 33 % (ref 12–46)
Lymphs Abs: 1.9 10*3/uL (ref 0.7–4.0)
MCV: 78.7 fL (ref 78.0–100.0)
Monocytes Relative: 9 % (ref 3–12)
Neutro Abs: 3.1 10*3/uL (ref 1.7–7.7)
RBC: 5.36 MIL/uL — ABNORMAL HIGH (ref 3.87–5.11)
RDW: 14.3 % (ref 11.5–15.5)
WBC: 5.7 10*3/uL (ref 4.0–10.5)

## 2012-07-08 LAB — COMPREHENSIVE METABOLIC PANEL
ALT: 11 U/L (ref 0–35)
AST: 13 U/L (ref 0–37)
CO2: 29 mEq/L (ref 19–32)
Calcium: 9.3 mg/dL (ref 8.4–10.5)
Chloride: 102 mEq/L (ref 96–112)
Creatinine, Ser: 0.93 mg/dL (ref 0.50–1.10)
GFR calc Af Amer: >90 mL/min (ref >90)
GFR calc non Af Amer: 80 mL/min — ABNORMAL LOW (ref >90)
Glucose, Bld: 115 mg/dL — ABNORMAL HIGH (ref 70–99)
Sodium: 138 mEq/L (ref 135–145)
Total Bilirubin: 0.2 mg/dL — ABNORMAL LOW (ref 0.3–1.2)

## 2012-07-08 MED ORDER — LORAZEPAM 1 MG PO TABS
1.0000 mg | ORAL_TABLET | Freq: Once | ORAL | Status: AC
  Administered 2012-07-08: 1 mg via ORAL
  Filled 2012-07-08: qty 1

## 2012-07-08 NOTE — ED Provider Notes (Signed)
History     CSN: [id number]  Arrival date & time 07/08/12  1632   First MD Initiated Contact with Patient 07/08/12 1714      Chief Complaint  Patient presents with  . Dizziness  . Nausea    (Consider location/radiation/quality/duration/timing/severity/associated sxs/prior treatment) Patient is a 31 y.o. female presenting with anxiety. The history is provided by the patient (the pt states she became anxious and weak today). No language interpreter was used.  Anxiety This is a new problem. The current episode started 6 to 12 hours ago. The problem occurs rarely. The problem has been resolved. Pertinent negatives include no chest pain, no abdominal pain and no headaches. Nothing aggravates the symptoms. Nothing relieves the symptoms. She has tried nothing for the symptoms. The treatment provided no relief.    Past Medical History  Diagnosis Date  . Hypertension     Past Surgical History  Procedure Date  . Cesarean section   . Tonsillectomy   . Ear tubes     History reviewed. No pertinent family history.  History  Substance Use Topics  . Smoking status: Never Smoker   . Smokeless tobacco: Not on file  . Alcohol Use: No    OB History    Grav Para Term Preterm Abortions TAB SAB Ect Mult Living   2 2              Review of Systems  Constitutional: Negative for fatigue.  HENT: Negative for congestion, sinus pressure and ear discharge.   Eyes: Negative for discharge.  Respiratory: Negative for cough.   Cardiovascular: Negative for chest pain.  Gastrointestinal: Negative for abdominal pain and diarrhea.  Genitourinary: Negative for frequency and hematuria.  Musculoskeletal: Negative for back pain.  Skin: Negative for rash.  Neurological: Negative for seizures and headaches.  Hematological: Negative.   Psychiatric/Behavioral: Positive for agitation. Negative for hallucinations.    Allergies  Aspirin  Home Medications   Current Outpatient Rx  Name  Route  Sig   Dispense  Refill  . ACETAMINOPHEN 500 MG PO TABS   Oral   Take 500 mg by mouth daily as needed. For pain         . HYDROCHLOROTHIAZIDE 25 MG PO TABS   Oral   Take 25 mg by mouth at bedtime.          Marland Kitchen NIFEDIPINE ER OSMOTIC 60 MG PO TB24   Oral   Take 60 mg by mouth daily.         Marland Kitchen LEVONORGESTREL 20 MCG/24HR IU IUD   Intrauterine   1 each by Intrauterine route once.           BP 153/92  Pulse 74  Temp 98.5 F (36.9 C)  Resp 18  Ht 5\' 5"  (1.651 m)  Wt 260 lb (117.935 kg)  BMI 43.27 kg/m2  SpO2 100%  Physical Exam  Constitutional: She is oriented to person, place, and time. She appears well-developed.  HENT:  Head: Normocephalic and atraumatic.  Eyes: Conjunctivae normal and EOM are normal. No scleral icterus.  Neck: Neck supple. No thyromegaly present.  Cardiovascular: Normal rate and regular rhythm.  Exam reveals no gallop and no friction rub.   No murmur heard. Pulmonary/Chest: No stridor. She has no wheezes. She has no rales. She exhibits no tenderness.  Abdominal: She exhibits no distension. There is no tenderness. There is no rebound.  Musculoskeletal: Normal range of motion. She exhibits no edema.  Lymphadenopathy:    She has no  cervical adenopathy.  Neurological: She is oriented to person, place, and time. Coordination normal.  Skin: No rash noted. No erythema.  Psychiatric:       Mildly anxious    ED Course  Procedures (including critical care time)  Labs Reviewed  CBC WITH DIFFERENTIAL - Abnormal; Notable for the following:    RBC 5.36 (*)     MCH 25.4 (*)     All other components within normal limits  COMPREHENSIVE METABOLIC PANEL - Abnormal; Notable for the following:    Potassium 3.2 (*)     Glucose, Bld 115 (*)     Total Bilirubin 0.2 (*)     GFR calc non Af Amer 80 (*)     All other components within normal limits   No results found.   1. Anxiety    Pt improved with tx   MDM          Benny Lennert, MD 07/08/12  [phone]

## 2012-07-08 NOTE — ED Notes (Signed)
Pt c/o elevated bp/dizziness/n since 1515 this afternoon. Denies ha/cp. nad noted.

## 2013-12-06 ENCOUNTER — Emergency Department (HOSPITAL_COMMUNITY)
Admission: EM | Admit: 2013-12-06 | Discharge: 2013-12-06 | Disposition: A | Discharge status: Home / Self Care | Payer: BC Managed Care – PPO | Attending: Emergency Medicine | Admitting: Emergency Medicine

## 2013-12-06 ENCOUNTER — Encounter (HOSPITAL_COMMUNITY): Payer: Self-pay | Admitting: Emergency Medicine

## 2013-12-06 DIAGNOSIS — R51 Headache: Secondary | ICD-10-CM

## 2013-12-06 DIAGNOSIS — J029 Acute pharyngitis, unspecified: Secondary | ICD-10-CM

## 2013-12-06 DIAGNOSIS — B349 Viral infection, unspecified: Secondary | ICD-10-CM

## 2013-12-06 DIAGNOSIS — M791 Myalgia, unspecified site: Secondary | ICD-10-CM

## 2013-12-06 DIAGNOSIS — B9789 Other viral agents as the cause of diseases classified elsewhere: Secondary | ICD-10-CM | POA: Insufficient documentation

## 2013-12-06 DIAGNOSIS — I10 Essential (primary) hypertension: Secondary | ICD-10-CM | POA: Insufficient documentation

## 2013-12-06 DIAGNOSIS — Z79899 Other long term (current) drug therapy: Secondary | ICD-10-CM | POA: Insufficient documentation

## 2013-12-06 DIAGNOSIS — R519 Headache, unspecified: Secondary | ICD-10-CM

## 2013-12-06 DIAGNOSIS — R0981 Nasal congestion: Secondary | ICD-10-CM

## 2013-12-06 MED ORDER — LORATADINE 10 MG PO TABS
10.0000 mg | ORAL_TABLET | Freq: Every day | ORAL | Status: DC
Start: 2013-12-06 — End: 2013-12-06
  Administered 2013-12-06: 10 mg via ORAL
  Filled 2013-12-06: qty 1

## 2013-12-06 MED ORDER — IBUPROFEN 400 MG PO TABS
600.0000 mg | ORAL_TABLET | Freq: Once | ORAL | Status: AC
  Administered 2013-12-06: 600 mg via ORAL
  Filled 2013-12-06 (×2): qty 1

## 2013-12-06 MED ORDER — PSEUDOEPHEDRINE HCL ER 120 MG PO TB12
120.0000 mg | ORAL_TABLET | Freq: Two times a day (BID) | ORAL | Status: DC
  Administered 2013-12-06: 120 mg via ORAL
  Filled 2013-12-06 (×2): qty 1

## 2013-12-06 NOTE — ED Notes (Signed)
Pt presents for HA, fever, chills and sore throat since yesterday around 1600. States the last time this happened she ended up having the flu. Has not received the flu shot this year.

## 2013-12-06 NOTE — ED Provider Notes (Signed)
CSN: [id number]     Arrival date & time 12/06/13  0734 History   First MD Initiated Contact with Patient 12/06/13 [phone]     Chief Complaint  Patient presents with  . Fever  . Chills  . Sore Throat  . Headache     (Consider location/radiation/quality/duration/timing/severity/associated sxs/prior Treatment) Patient is a 33 y.o. female presenting with fever, pharyngitis, and headaches. The history is provided by the patient.  Fever Associated symptoms: congestion, headaches and sore throat   Associated symptoms: no chest pain, no chills, no confusion, no cough, no diarrhea, no dysuria, no rash and no vomiting   Sore Throat Associated symptoms include headaches. Pertinent negatives include no chest pain, no abdominal pain and no shortness of breath.  Headache Associated symptoms: congestion, fever and sore throat   Associated symptoms: no abdominal pain, no back pain, no cough, no diarrhea, no neck pain, no neck stiffness and no vomiting   pt c/o scratchy throat, body aches, fever, nasal congestion, intermittent headache, occasional sneezing, for the past 1-2 days. Symptoms gradual onset, persistent, moderate in severity.  Temp 101. No chills/sweats. Is eating and drinking, normal appetite. No nvd. No dysuria or gu c/o. No abd pain. No chest pain. Denies cough or sob. Family member w recent uri c/o, and works as Pharmacist, hospital so ?exposure to uri symptoms there. No rash. Headaches gradual onset, frontal, c/w prior headaches, mild-mod. No neck pain or stiffness. No eye pain or change in vision. No numbness or weakness.     Past Medical History  Diagnosis Date  . Hypertension    Past Surgical History  Procedure Laterality Date  . Cesarean section    . Tonsillectomy    . Ear tubes     No family history on file. History  Substance Use Topics  . Smoking status: Never Smoker   . Smokeless tobacco: Not on file  . Alcohol Use: No   OB History   Grav Para Term Preterm Abortions TAB SAB Ect  Mult Living   2 2             Review of Systems  Constitutional: Positive for fever. Negative for chills.  HENT: Positive for congestion, sneezing and sore throat.   Eyes: Negative for discharge and redness.  Respiratory: Negative for cough and shortness of breath.   Cardiovascular: Negative for chest pain and leg swelling.  Gastrointestinal: Negative for vomiting, abdominal pain and diarrhea.  Genitourinary: Negative for dysuria and flank pain.  Musculoskeletal: Negative for back pain, neck pain and neck stiffness.  Skin: Negative for rash.  Neurological: Positive for headaches. Negative for syncope.  Hematological: Does not bruise/bleed easily.  Psychiatric/Behavioral: Negative for confusion.      Allergies  Aspirin  Home Medications   Prior to Admission medications   Medication Sig Start Date End Date Taking? Authorizing Provider  acetaminophen (TYLENOL) 500 MG tablet Take 500 mg by mouth daily as needed. For pain   Yes Historical Provider, MD  Cholecalciferol (VITAMIN D PO) Take 1 tablet by mouth daily.   Yes Historical Provider, MD  hydrochlorothiazide (HYDRODIURIL) 25 MG tablet Take 25 mg by mouth at bedtime.    Yes Historical Provider, MD  levonorgestrel (MIRENA) 20 MCG/24HR IUD 1 each by Intrauterine route once.   Yes Historical Provider, MD  NIFEdipine (PROCARDIA XL/ADALAT-CC) 60 MG 24 hr tablet Take 60 mg by mouth daily.   Yes Historical Provider, MD   BP 124/79  Pulse 98  Temp(Src) 99.7 F (37.6 C) (Oral)  Ht 5\' 6"  (1.676 m)  Wt 257 lb (116.574 kg)  BMI 41.50 kg/m2  SpO2 98% Physical Exam  Nursing note and vitals reviewed. Constitutional: She is oriented to person, place, and time. She appears well-developed and well-nourished. No distress.  HENT:  Mouth/Throat: Oropharynx is clear and moist.  Nasal congestion. No sinus or temporal tenderness.  Pharynx mildly erythematous, no asymmetric swelling, abscess or exudate. tms wnl. No mastoid tenderness.    Eyes:  Conjunctivae are normal. Pupils are equal, round, and reactive to light. No scleral icterus.  Neck: Normal range of motion. Neck supple. No tracheal deviation present.  No stiffness or rigidity.   Cardiovascular: Normal rate, regular rhythm, normal heart sounds and intact distal pulses.  Exam reveals no gallop and no friction rub.   No murmur heard. Pulmonary/Chest: Effort normal and breath sounds normal. No respiratory distress.  Abdominal: Soft. Normal appearance and bowel sounds are normal. She exhibits no distension. There is no tenderness.  Genitourinary:  No cva tenderness  Musculoskeletal: She exhibits no edema and no tenderness.  Lymphadenopathy:    She has no cervical adenopathy.  Neurological: She is alert and oriented to person, place, and time.  Motor intact bil. Normal coordination. Steady gait.   Skin: Skin is warm and dry. No rash noted. She is not diaphoretic.  Psychiatric: She has a normal mood and affect.    ED Course  Procedures (including critical care time)     MDM  Po fluids. Motrin po. Anthist/decongestant.   Hx/exam felt most consistent with viral illness.   Recommend symptom management, rest, fluids.    Mirna Mires, MD 12/06/13 [phone]

## 2013-12-06 NOTE — Discharge Instructions (Signed)
Rest. Drink plenty of fluids. You may take tylenol and/or motrin as need for fever. You may try over the counter multi-symptom cold and flu medication like mucinex, triaminic, or robitussin as need for symptom relief.  Follow up with primary care doctor in 1 week for recheck if symptoms fail to improve/resolve. Return to ER if worse, trouble breathing, difficulty swallowing, other concern.    Viral Infections A viral infection can be caused by different types of viruses.Most viral infections are not serious and resolve on their own. However, some infections may cause severe symptoms and may lead to further complications. SYMPTOMS Viruses can frequently cause:  Minor sore throat.  Aches and pains.  Headaches.  Runny nose.  Different types of rashes.  Watery eyes.  Tiredness.  Cough.  Loss of appetite.  Gastrointestinal infections, resulting in nausea, vomiting, and diarrhea. These symptoms do not respond to antibiotics because the infection is not caused by bacteria. However, you might catch a bacterial infection following the viral infection. This is sometimes called a "superinfection." Symptoms of such a bacterial infection may include:  Worsening sore throat with pus and difficulty swallowing.  Swollen neck glands.  Chills and a high or persistent fever.  Severe headache.  Tenderness over the sinuses.  Persistent overall ill feeling (malaise), muscle aches, and tiredness (fatigue).  Persistent cough.  Yellow, green, or brown mucus production with coughing. HOME CARE INSTRUCTIONS   Only take over-the-counter or prescription medicines for pain, discomfort, diarrhea, or fever as directed by your caregiver.  Drink enough water and fluids to keep your urine clear or pale yellow. Sports drinks can provide valuable electrolytes, sugars, and hydration.  Get plenty of rest and maintain proper nutrition. Soups and broths with crackers or rice are fine. SEEK IMMEDIATE  MEDICAL CARE IF:   You have severe headaches, shortness of breath, chest pain, neck pain, or an unusual rash.  You have uncontrolled vomiting, diarrhea, or you are unable to keep down fluids.  You or your child has an oral temperature above 102 F (38.9 C), not controlled by medicine.  Your baby is older than 3 months with a rectal temperature of 102 F (38.9 C) or higher.  Your baby is 28 months old or younger with a rectal temperature of 100.4 F (38 C) or higher. MAKE SURE YOU:   Understand these instructions.  Will watch your condition.  Will get help right away if you are not doing well or get worse. Document Released: 04/24/2005 Document Revised: 10/07/2011 Document Reviewed: 11/19/2010 [facility] Patient Information 2014 [place], [place].    Viral Pharyngitis Viral pharyngitis is a viral infection that produces redness, pain, and swelling (inflammation) of the throat. It can spread from person to person (contagious). CAUSES Viral pharyngitis is caused by inhaling a large amount of certain germs called viruses. Many different viruses cause viral pharyngitis. SYMPTOMS Symptoms of viral pharyngitis include:  Sore throat.  Tiredness.  Stuffy nose.  Low-grade fever.  Congestion.  Cough. TREATMENT Treatment includes rest, drinking plenty of fluids, and the use of over-the-counter medication (approved by your caregiver). HOME CARE INSTRUCTIONS   Drink enough fluids to keep your urine clear or pale yellow.  Eat soft, cold foods such as ice cream, frozen ice pops, or gelatin dessert.  Gargle with warm salt water (1 tsp salt per 1 qt of water).  If over age 77, throat lozenges may be used safely.  Only take over-the-counter or prescription medicines for pain, discomfort, or fever as directed by your caregiver.  Do not take aspirin. To help prevent spreading viral pharyngitis to others, avoid:  Mouth-to-mouth contact with others.  Sharing utensils for eating and  drinking.  Coughing around others. SEEK MEDICAL CARE IF:   You are better in a few days, then become worse.  You have a fever or pain not helped by pain medicines.  There are any other changes that concern you. Document Released: 04/24/2005 Document Revised: 10/07/2011 Document Reviewed: 09/20/2010 [facility] Patient Information 2014 [place], [place].    Upper Respiratory Infection, Adult An upper respiratory infection (URI) is also known as the common cold. It is often caused by a type of germ (virus). Colds are easily spread (contagious). You can pass it to others by kissing, coughing, sneezing, or drinking out of the same glass. Usually, you get better in 1 or 2 weeks.  HOME CARE   Only take medicine as told by your doctor.  Use a warm mist humidifier or breathe in steam from a hot shower.  Drink enough water and fluids to keep your pee (urine) clear or pale yellow.  Get plenty of rest.  Return to work when your temperature is back to normal or as told by your doctor. You may use a face mask and wash your hands to stop your cold from spreading. GET HELP RIGHT AWAY IF:   After the first few days, you feel you are getting worse.  You have questions about your medicine.  You have chills, shortness of breath, or brown or red spit (mucus).  You have yellow or brown snot (nasal discharge) or pain in the face, especially when you bend forward.  You have a fever, puffy (swollen) neck, pain when you swallow, or white spots in the back of your throat.  You have a bad headache, ear pain, sinus pain, or chest pain.  You have a high-pitched whistling sound when you breathe in and out (wheezing).  You have a lasting cough or cough up blood.  You have sore muscles or a stiff neck. MAKE SURE YOU:   Understand these instructions.  Will watch your condition.  Will get help right away if you are not doing well or get worse. Document Released: 01/01/2008 Document Revised: 10/07/2011  Document Reviewed: 11/19/2010 [facility] Patient Information 2014 [place], [place].

## 2013-12-06 NOTE — ED Notes (Signed)
Dr. Ashok Cordia at the bedside.

## 2014-05-30 ENCOUNTER — Encounter (HOSPITAL_COMMUNITY): Payer: Self-pay | Admitting: Emergency Medicine

## 2014-10-14 ENCOUNTER — Encounter (HOSPITAL_COMMUNITY): Payer: Self-pay | Admitting: Family Medicine

## 2014-10-14 ENCOUNTER — Emergency Department (HOSPITAL_COMMUNITY)
Admission: EM | Admit: 2014-10-14 | Discharge: 2014-10-14 | Disposition: A | Discharge status: Home / Self Care | Payer: BC Managed Care – PPO | Attending: Emergency Medicine | Admitting: Emergency Medicine

## 2014-10-14 ENCOUNTER — Emergency Department (HOSPITAL_COMMUNITY): Payer: BC Managed Care – PPO

## 2014-10-14 DIAGNOSIS — Z79899 Other long term (current) drug therapy: Secondary | ICD-10-CM | POA: Insufficient documentation

## 2014-10-14 DIAGNOSIS — I10 Essential (primary) hypertension: Secondary | ICD-10-CM | POA: Diagnosis not present

## 2014-10-14 DIAGNOSIS — J069 Acute upper respiratory infection, unspecified: Secondary | ICD-10-CM | POA: Diagnosis not present

## 2014-10-14 DIAGNOSIS — R110 Nausea: Secondary | ICD-10-CM | POA: Diagnosis not present

## 2014-10-14 DIAGNOSIS — R05 Cough: Secondary | ICD-10-CM

## 2014-10-14 DIAGNOSIS — R059 Cough, unspecified: Secondary | ICD-10-CM

## 2014-10-14 MED ORDER — ONDANSETRON 4 MG PO TBDP
4.0000 mg | ORAL_TABLET | Freq: Once | ORAL | Status: AC
  Administered 2014-10-14: 4 mg via ORAL
  Filled 2014-10-14: qty 1

## 2014-10-14 MED ORDER — HYDROCODONE-HOMATROPINE 5-1.5 MG/5ML PO SYRP: 5.0000 mL | ORAL_SOLUTION | Freq: Four times a day (QID) | ORAL | Status: DC | PRN

## 2014-10-14 MED ORDER — ONDANSETRON 4 MG PO TBDP: 4.0000 mg | ORAL_TABLET | Freq: Three times a day (TID) | ORAL | Status: DC | PRN

## 2014-10-14 NOTE — Discharge Instructions (Signed)
Upper Respiratory Infection, Adult An upper respiratory infection (URI) is also known as the common cold. It is often caused by a type of germ (virus). Colds are easily spread (contagious). You can pass it to others by kissing, coughing, sneezing, or drinking out of the same glass. Usually, you get better in 1 or 2 weeks.  HOME CARE   Only take medicine as told by your doctor.  Use a warm mist humidifier or breathe in steam from a hot shower.  Drink enough water and fluids to keep your pee (urine) clear or pale yellow.  Get plenty of rest.  Return to work when your temperature is back to normal or as told by your doctor. You may use a face mask and wash your hands to stop your cold from spreading. GET HELP RIGHT AWAY IF:   After the first few days, you feel you are getting worse.  You have questions about your medicine.  You have chills, shortness of breath, or brown or red spit (mucus).  You have yellow or brown snot (nasal discharge) or pain in the face, especially when you bend forward.  You have a fever, puffy (swollen) neck, pain when you swallow, or white spots in the back of your throat.  You have a bad headache, ear pain, sinus pain, or chest pain.  You have a high-pitched whistling sound when you breathe in and out (wheezing).  You have a lasting cough or cough up blood.  You have sore muscles or a stiff neck. MAKE SURE YOU:   Understand these instructions.  Will watch your condition.  Will get help right away if you are not doing well or get worse. Document Released: 01/01/2008 Document Revised: 10/07/2011 Document Reviewed: 10/20/2013 [employer] Patient Information 2015 [employer]. This information is not intended to replace advice given to you by your health care provider. Make sure you discuss any questions you have with your health care provider.  

## 2014-10-14 NOTE — ED Provider Notes (Signed)
CSN: [id number]     Arrival date & time 10/14/14  1145 History  This chart was scribed for non-physician practitioner, Glendell Docker, NP, working with Elnora Morrison, MD by Ladene Artist, ED Scribe. This patient was seen in room TR09C/TR09C and the patient's care was started at 12:36 PM.   Chief Complaint  Patient presents with  . Cough  . Nasal Congestion   The history is provided by the patient. No language interpreter was used.   HPI Comments: Deborah Garza is a 34 y.o. female,with a h/o HTN, who presents to the Emergency Department complaining of generalized body aches for the past 2 days. She reports associated nasal congestion, productive cough with phlegm, ear pain, nausea, sore throat 2 days ago that has resolved.   Past Medical History  Diagnosis Date  . Hypertension    Past Surgical History  Procedure Laterality Date  . Cesarean section    . Tonsillectomy    . Ear tubes     History reviewed. No pertinent family history. History  Substance Use Topics  . Smoking status: Never Smoker   . Smokeless tobacco: Not on file  . Alcohol Use: No   OB History    Gravida Para Term Preterm AB TAB SAB Ectopic Multiple Living   2 2             Review of Systems  HENT: Positive for congestion and sore throat.   Respiratory: Positive for cough.   Gastrointestinal: Positive for nausea.  Musculoskeletal: Positive for myalgias.   Allergies  Aspirin  Home Medications   Prior to Admission medications   Medication Sig Start Date End Date Taking? Authorizing Provider  acetaminophen (TYLENOL) 500 MG tablet Take 500 mg by mouth daily as needed. For pain    Historical Provider, MD  Cholecalciferol (VITAMIN D PO) Take 1 tablet by mouth daily.    Historical Provider, MD  hydrochlorothiazide (HYDRODIURIL) 25 MG tablet Take 25 mg by mouth at bedtime.     Historical Provider, MD  levonorgestrel (MIRENA) 20 MCG/24HR IUD 1 each by Intrauterine route once.    Historical Provider, MD   NIFEdipine (PROCARDIA XL/ADALAT-CC) 60 MG 24 hr tablet Take 60 mg by mouth daily.    Historical Provider, MD   BP 130/80 mmHg  Pulse 68  Temp(Src) 98.7 F (37.1 C) (Oral)  Resp 14  SpO2 97% Physical Exam  Constitutional: She is oriented to person, place, and time. She appears well-developed and well-nourished. No distress.  HENT:  Head: Normocephalic and atraumatic.  Right Ear: Tympanic membrane normal.  Left Ear: Tympanic membrane normal.  Mouth/Throat: Posterior oropharyngeal erythema present.  Nasal congestion.  Eyes: Conjunctivae and EOM are normal.  Neck: Neck supple. No tracheal deviation present.  Cardiovascular: Normal rate.   Pulmonary/Chest: Effort normal. No respiratory distress.  Lungs are clear to auscultation.   Musculoskeletal: Normal range of motion.  Neurological: She is alert and oriented to person, place, and time.  Skin: Skin is warm and dry.  Psychiatric: She has a normal mood and affect. Her behavior is normal.  Nursing note and vitals reviewed.  ED Course  Procedures (including critical care time) DIAGNOSTIC STUDIES: Oxygen Saturation is 97% on RA, normal by my interpretation.    COORDINATION OF CARE: 12:38 PM-Discussed treatment plan which includes CXR with pt at bedside and pt agreed to plan.   Labs Review Labs Reviewed - No data to display  Imaging Review Dg Chest 2 View  10/14/2014   CLINICAL DATA:  Productive cough and body aches  EXAM: CHEST  2 VIEW  COMPARISON:  12/10/2011  FINDINGS: Normal heart size and mediastinal contours. No acute infiltrate or edema. No effusion or pneumothorax. No acute osseous findings.  IMPRESSION: Negative chest.   Electronically Signed   By: Monte Fantasia M.D.   On: 10/14/2014 12:43     EKG Interpretation None      MDM   Final diagnoses:  Viral URI  Nausea    Non toxic in appearance. Viral in nature. No pneumonia noted on x-ray. Will given zofran and hydromet  I personally performed the services  described in this documentation, which was scribed in my presence. The recorded information has been reviewed and is accurate.    Glendell Docker, NP 10/14/14 1249  Elnora Morrison, MD 10/14/14 [phone]

## 2014-10-14 NOTE — ED Notes (Signed)
Per pt sts nasal congestion, cough, and some nausea. sts her head feels full.

## 2015-05-01 ENCOUNTER — Emergency Department (HOSPITAL_COMMUNITY)
Admission: EM | Admit: 2015-05-01 | Discharge: 2015-05-01 | Disposition: A | Discharge status: Home / Self Care | Payer: BC Managed Care – PPO | Attending: Emergency Medicine | Admitting: Emergency Medicine

## 2015-05-01 ENCOUNTER — Encounter (HOSPITAL_COMMUNITY): Payer: Self-pay | Admitting: *Deleted

## 2015-05-01 ENCOUNTER — Emergency Department (HOSPITAL_COMMUNITY): Payer: BC Managed Care – PPO

## 2015-05-01 DIAGNOSIS — Z3202 Encounter for pregnancy test, result negative: Secondary | ICD-10-CM | POA: Diagnosis not present

## 2015-05-01 DIAGNOSIS — Z79899 Other long term (current) drug therapy: Secondary | ICD-10-CM | POA: Insufficient documentation

## 2015-05-01 DIAGNOSIS — R109 Unspecified abdominal pain: Secondary | ICD-10-CM | POA: Diagnosis present

## 2015-05-01 DIAGNOSIS — N200 Calculus of kidney: Secondary | ICD-10-CM | POA: Diagnosis not present

## 2015-05-01 DIAGNOSIS — I10 Essential (primary) hypertension: Secondary | ICD-10-CM | POA: Insufficient documentation

## 2015-05-01 LAB — COMPREHENSIVE METABOLIC PANEL
ALT: 18 U/L (ref 14–54)
AST: 21 U/L (ref 15–41)
Albumin: 3.7 g/dL (ref 3.5–5.0)
Alkaline Phosphatase: 49 U/L (ref 38–126)
Anion gap: 9 (ref 5–15)
BILIRUBIN TOTAL: 0.4 mg/dL (ref 0.3–1.2)
BUN: 8 mg/dL (ref 6–20)
CHLORIDE: 104 mmol/L (ref 101–111)
CO2: 23 mmol/L (ref 22–32)
CREATININE: 0.88 mg/dL (ref 0.44–1.00)
Calcium: 8.9 mg/dL (ref 8.9–10.3)
Glucose, Bld: 128 mg/dL — ABNORMAL HIGH (ref 65–99)
Potassium: 3.5 mmol/L (ref 3.5–5.1)
Sodium: 136 mmol/L (ref 135–145)
TOTAL PROTEIN: 7 g/dL (ref 6.5–8.1)

## 2015-05-01 LAB — URINALYSIS, ROUTINE W REFLEX MICROSCOPIC
Bilirubin Urine: NEGATIVE
GLUCOSE, UA: NEGATIVE mg/dL
KETONES UR: NEGATIVE mg/dL
Nitrite: NEGATIVE
PROTEIN: NEGATIVE mg/dL
Specific Gravity, Urine: 1.013 (ref 1.005–1.030)
UROBILINOGEN UA: 0.2 mg/dL (ref 0.0–1.0)
pH: 7 (ref 5.0–8.0)

## 2015-05-01 LAB — URINE MICROSCOPIC-ADD ON

## 2015-05-01 LAB — CBC
HCT: 44.6 % (ref 36.0–46.0)
HEMOGLOBIN: 14.2 g/dL (ref 12.0–15.0)
MCH: 25.1 pg — AB (ref 26.0–34.0)
MCHC: 31.8 g/dL (ref 30.0–36.0)
MCV: 78.8 fL (ref 78.0–100.0)
PLATELETS: 191 10*3/uL (ref 150–400)
RBC: 5.66 MIL/uL — AB (ref 3.87–5.11)
RDW: 14 % (ref 11.5–15.5)
WBC: 7.6 10*3/uL (ref 4.0–10.5)

## 2015-05-01 LAB — I-STAT BETA HCG BLOOD, ED (MC, WL, AP ONLY)

## 2015-05-01 LAB — LIPASE, BLOOD: LIPASE: 16 U/L — AB (ref 22–51)

## 2015-05-01 MED ORDER — ONDANSETRON HCL 4 MG/2ML IJ SOLN
4.0000 mg | Freq: Once | INTRAMUSCULAR | Status: DC
  Filled 2015-05-01: qty 2

## 2015-05-01 MED ORDER — HYDROMORPHONE HCL 1 MG/ML IJ SOLN
1.0000 mg | INTRAMUSCULAR | Status: DC | PRN
  Filled 2015-05-01: qty 1

## 2015-05-01 MED ORDER — OXYCODONE-ACETAMINOPHEN 5-325 MG PO TABS: 1.0000 | ORAL_TABLET | Freq: Four times a day (QID) | ORAL | Status: DC | PRN

## 2015-05-01 MED ORDER — ACETAMINOPHEN 325 MG PO TABS
650.0000 mg | ORAL_TABLET | Freq: Once | ORAL | Status: AC
  Administered 2015-05-01: 650 mg via ORAL
  Filled 2015-05-01: qty 2

## 2015-05-01 MED ORDER — ONDANSETRON 4 MG PO TBDP
4.0000 mg | ORAL_TABLET | Freq: Three times a day (TID) | ORAL | Status: DC | PRN
Start: 2015-05-01 — End: 2015-09-01

## 2015-05-01 MED ORDER — SODIUM CHLORIDE 0.9 % IV SOLN
1000.0000 mL | Freq: Once | INTRAVENOUS | Status: AC
  Administered 2015-05-01: 1000 mL via INTRAVENOUS

## 2015-05-01 MED ORDER — SODIUM CHLORIDE 0.9 % IV SOLN: 1000.0000 mL | INTRAVENOUS | Status: DC

## 2015-05-01 MED ORDER — CEPHALEXIN 500 MG PO CAPS: 500.0000 mg | ORAL_CAPSULE | Freq: Four times a day (QID) | ORAL | Status: DC

## 2015-05-01 NOTE — ED Notes (Signed)
Patient undressed, in gown; patient at this time ambulatory to the bathroom to attempt an urine sample; visitor at bedside

## 2015-05-01 NOTE — ED Notes (Signed)
Patient placed on continuous pulse oximetry and blood pressure cuff 

## 2015-05-01 NOTE — ED Provider Notes (Signed)
CSN: [id number]     Arrival date & time 05/01/15  0211 History   First MD Initiated Contact with Patient 05/01/15 0825     Chief Complaint  Patient presents with  . Abdominal Pain   HPI Patient presents to the emergency room with complaints of left lower abdominal and flank pain that started early this morning. Patient felt well yesterday. She took her usual medications including a diarrhetic tablet 4 bedtime. Patient had to go to the bathroom last night which is unusual for her but after doing that she experienced severe pain in the left flank and groin area. The pain has persisted. It is very severe and is worse than labor. She is not having any burning when she urinates but does feel like she is urinating more frequently than usual. She denies any fevers or chills.  No vomiting. She has had a loose stool but not several episodes. She does feel nauseated. Denies any vaginal bleeding or discharge. No prior history of similar symptoms in the past. Her mother does have a history of kidney stones. Past Medical History  Diagnosis Date  . Hypertension    Past Surgical History  Procedure Laterality Date  . Cesarean section    . Tonsillectomy    . Ear tubes     No family history on file. Social History  Substance Use Topics  . Smoking status: Never Smoker   . Smokeless tobacco: None  . Alcohol Use: No   OB History    Gravida Para Term Preterm AB TAB SAB Ectopic Multiple Living   2 2             Review of Systems  All other systems reviewed and are negative.     Allergies  Aspirin  Home Medications   Prior to Admission medications   Medication Sig Start Date End Date Taking? Authorizing Provider  acetaminophen (TYLENOL) 500 MG tablet Take 500 mg by mouth daily as needed. For pain   Yes Historical Provider, MD  Cholecalciferol (VITAMIN D PO) Take 1 tablet by mouth daily.   Yes Historical Provider, MD  etonogestrel (NEXPLANON) 68 MG IMPL implant 1 each by Subdermal route once.    Yes Historical Provider, MD  hydrochlorothiazide (HYDRODIURIL) 25 MG tablet Take 25 mg by mouth at bedtime.    Yes Historical Provider, MD  NIFEdipine (PROCARDIA XL/ADALAT-CC) 60 MG 24 hr tablet Take 60 mg by mouth daily.   Yes Historical Provider, MD  levonorgestrel (MIRENA) 20 MCG/24HR IUD 1 each by Intrauterine route once.    Historical Provider, MD  ondansetron (ZOFRAN ODT) 4 MG disintegrating tablet Take 1 tablet (4 mg total) by mouth every 8 (eight) hours as needed for nausea or vomiting. 05/01/15   Dorie Rank, MD  oxyCODONE-acetaminophen (PERCOCET/ROXICET) 5-325 MG tablet Take 1 tablet by mouth every 6 (six) hours as needed. 05/01/15   Dorie Rank, MD   BP 111/63 mmHg  Pulse 79  Temp(Src) 99.2 F (37.3 C) (Oral)  Resp 15  Ht 5\' 5"  (1.651 m)  Wt 250 lb (113.399 kg)  BMI 41.60 kg/m2  SpO2 100%  LMP 04/16/2015 Physical Exam  Constitutional: She appears well-developed and well-nourished. No distress.  HENT:  Head: Normocephalic and atraumatic.  Right Ear: External ear normal.  Left Ear: External ear normal.  Eyes: Conjunctivae are normal. Right eye exhibits no discharge. Left eye exhibits no discharge. No scleral icterus.  Neck: Neck supple. No tracheal deviation present.  Cardiovascular: Normal rate, regular rhythm and intact distal  pulses.   Pulmonary/Chest: Effort normal and breath sounds normal. No stridor. No respiratory distress. She has no wheezes. She has no rales.  Abdominal: Soft. Bowel sounds are normal. She exhibits no distension. There is no tenderness. There is no rebound and no guarding.  No CVA tenderness, no tenderness palpation on exam of left lower quadrant or inguinal region no hernias appreciated  Musculoskeletal: She exhibits no edema or tenderness.  Neurological: She is alert. She has normal strength. No cranial nerve deficit (no facial droop, extraocular movements intact, no slurred speech) or sensory deficit. She exhibits normal muscle tone. She displays no  seizure activity. Coordination normal.  Skin: Skin is warm and dry. No rash noted.  Psychiatric: She has a normal mood and affect.  Nursing note and vitals reviewed.   ED Course  Procedures (including critical care time) Labs Review Labs Reviewed  CBC - Abnormal; Notable for the following:    RBC 5.66 (*)    MCH 25.1 (*)    All other components within normal limits  URINALYSIS, ROUTINE W REFLEX MICROSCOPIC (NOT AT [facility]) - Abnormal; Notable for the following:    APPearance CLOUDY (*)    Hgb urine dipstick MODERATE (*)    Leukocytes, UA TRACE (*)    All other components within normal limits  COMPREHENSIVE METABOLIC PANEL - Abnormal; Notable for the following:    Glucose, Bld 128 (*)    All other components within normal limits  LIPASE, BLOOD - Abnormal; Notable for the following:    Lipase 16 (*)    All other components within normal limits  URINE MICROSCOPIC-ADD ON - Abnormal; Notable for the following:    Squamous Epithelial / LPF FEW (*)    Bacteria, UA FEW (*)    All other components within normal limits  I-STAT BETA HCG BLOOD, ED ([facility], [facility], [facility] ONLY)    Imaging Review Ct Abdomen Pelvis Wo Contrast  05/01/2015   CLINICAL DATA:  Left lower quadrant abdominal pain radiating into the left groin. Dysuria and nausea since last night.  EXAM: CT ABDOMEN AND PELVIS WITHOUT CONTRAST  TECHNIQUE: Multidetector CT imaging of the abdomen and pelvis was performed following the standard protocol without IV contrast.  COMPARISON:  None.  FINDINGS: 3 mm lower pole right renal calculus. 4 mm mid left renal calculus. Minimal dilatation of the left renal collecting system and ureter with minimal perinephric soft tissue stranding. Ureters dilated to the level of the inferior pelvis with no visible calculi within the ureter. There is a 4 mm calcification adjacent to the posterior aspect of the urinary bladder on the left on image number 79. This appears to be outside of the bladder and ureter. There are  additional small bilateral pelvic phleboliths.  Multiple poorly defined uterine masses containing coarse calcifications.  Multiple colonic diverticula. These include the sigmoid region, with minimal adjacent soft tissue stranding at the level of the proximal sigmoid colon. No fluid collections, free peritoneal fluid or free peritoneal air. No gastric or small bowel abnormalities are seen. Normal appearing appendix.  8 mm oval area of low density in the lateral segment of the left lobe of the liver on image 20. Normal non contrasted appearance of the spleen, pancreas, gallbladder and adrenal glands. No adnexal masses or enlarged lymph nodes.  Clear lung bases.  Unremarkable bones.  IMPRESSION: 1. Probable recently passed left ureteral calculus with associated minimal residual left hydronephrosis and hydroureter and minimal perinephric soft tissue stranding. 2. Diverticulosis with minimal changes of proximal  sigmoid diverticulitis. 3. Small, nonobstructing bilateral renal calculi. 4. 8 mm probable cyst in the liver.  An hemangioma is less likely. 5. Uterine fibroids.   Electronically Signed   By: Claudie Revering M.D.   On: 05/01/2015 12:08   I have personally reviewed and evaluated these images and lab results as part of my medical decision-making.    MDM   Final diagnoses:  Kidney stones   Patient's CT scan shows a probable recently passed left ureteral calculus. This does correlate with her complaints, physical exam finding and laboratory tests. UA does show trace leukocyte esterase and 11-20 white blood cells. I will start the patient on oral Keflex to cover for possible urinary tract infection component. Patient does think she passed a stone while she was here in the emergency department. Plan on discharge home with medications for pain. Follow-up with primary care doctor or urology as needed    Dorie Rank, MD 05/01/15 1308

## 2015-05-01 NOTE — ED Notes (Signed)
Pt moaning at triage with left lower abdominal pain and pain is radiating around to groin.  Pt states took diuretic last nite and states she urinated about 15 times last nite and then one time she went to urinate and it started hurting.  Pt states she did have some nausea.  Pt restless

## 2015-05-01 NOTE — Discharge Instructions (Signed)

## 2015-07-30 ENCOUNTER — Encounter (HOSPITAL_COMMUNITY): Payer: Self-pay | Admitting: Emergency Medicine

## 2015-07-30 ENCOUNTER — Emergency Department (HOSPITAL_COMMUNITY): Payer: BC Managed Care – PPO

## 2015-07-30 ENCOUNTER — Emergency Department (HOSPITAL_COMMUNITY)
Admission: EM | Admit: 2015-07-30 | Discharge: 2015-07-30 | Disposition: A | Discharge status: Home / Self Care | Payer: BC Managed Care – PPO | Attending: Emergency Medicine | Admitting: Emergency Medicine

## 2015-07-30 DIAGNOSIS — M25511 Pain in right shoulder: Secondary | ICD-10-CM | POA: Diagnosis not present

## 2015-07-30 DIAGNOSIS — G8929 Other chronic pain: Secondary | ICD-10-CM | POA: Diagnosis not present

## 2015-07-30 DIAGNOSIS — Z79899 Other long term (current) drug therapy: Secondary | ICD-10-CM | POA: Insufficient documentation

## 2015-07-30 DIAGNOSIS — Z792 Long term (current) use of antibiotics: Secondary | ICD-10-CM | POA: Insufficient documentation

## 2015-07-30 DIAGNOSIS — I10 Essential (primary) hypertension: Secondary | ICD-10-CM | POA: Insufficient documentation

## 2015-07-30 LAB — POC URINE PREG, ED: PREG TEST UR: NEGATIVE

## 2015-07-30 MED ORDER — DEXAMETHASONE SODIUM PHOSPHATE 4 MG/ML IJ SOLN
8.0000 mg | Freq: Once | INTRAMUSCULAR | Status: AC
  Administered 2015-07-30: 8 mg via INTRAMUSCULAR
  Filled 2015-07-30: qty 2

## 2015-07-30 MED ORDER — HYDROCODONE-ACETAMINOPHEN 5-325 MG PO TABS: 1.0000 | ORAL_TABLET | ORAL | Status: DC | PRN

## 2015-07-30 MED ORDER — MELOXICAM 15 MG PO TABS: 15.0000 mg | ORAL_TABLET | Freq: Every day | ORAL | Status: DC

## 2015-07-30 MED ORDER — KETOROLAC TROMETHAMINE 60 MG/2ML IM SOLN
60.0000 mg | Freq: Once | INTRAMUSCULAR | Status: AC
  Administered 2015-07-30: 60 mg via INTRAMUSCULAR
  Filled 2015-07-30: qty 2

## 2015-07-30 MED ORDER — DEXAMETHASONE 4 MG PO TABS: 4.0000 mg | ORAL_TABLET | Freq: Two times a day (BID) | ORAL | Status: DC

## 2015-07-30 NOTE — ED Provider Notes (Signed)
CSN: [id number]     Arrival date & time 07/30/15  1859 History  By signing my name below, I, Rayna Sexton, attest that this documentation has been prepared under the direction and in the presence of Lily Kocher, PA-C. Electronically Signed: Rayna Sexton, ED Scribe. 07/30/2015. 7:54 PM.   Chief Complaint  Patient presents with  . Arm Pain   Patient is a 35 y.o. female presenting with arm pain. The history is provided by the patient.  Arm Pain This is a chronic problem. The current episode started more than 1 week ago. The problem occurs constantly. The problem has been gradually worsening. The symptoms are relieved by medications and NSAIDs.    HPI Comments: Deborah Garza is a 35 y.o. female who presents to the Emergency Department complaining of constant, moderate, right shoulder pain with onset 1 month ago and a recent worsening of her symptoms. She notes her PCP said the joint is "inflamed" and believes he dx her with bursitis about 2 weeks ago during her most recent visit. She notes that she has ran out of her rx medication including prednisone and duexis with her last dosage this morning. Pt notes associated joint swelling and worsening pain with movement. She denies any other associated symptoms at this time.    Past Medical History  Diagnosis Date  . Hypertension    Past Surgical History  Procedure Laterality Date  . Cesarean section    . Tonsillectomy    . Ear tubes     No family history on file. Social History  Substance Use Topics  . Smoking status: Never Smoker   . Smokeless tobacco: None  . Alcohol Use: No   OB History    Gravida Para Term Preterm AB TAB SAB Ectopic Multiple Living   2 2              Review of Systems  Musculoskeletal: Positive for joint swelling and arthralgias.  All other systems reviewed and are negative.   Allergies  Aspirin  Home Medications   Prior to Admission medications   Medication Sig Start Date End Date Taking?  Authorizing Provider  acetaminophen (TYLENOL) 500 MG tablet Take 500 mg by mouth daily as needed. For pain    Historical Provider, MD  cephALEXin (KEFLEX) 500 MG capsule Take 1 capsule (500 mg total) by mouth 4 (four) times daily. 05/01/15   Dorie Rank, MD  Cholecalciferol (VITAMIN D PO) Take 1 tablet by mouth daily.    Historical Provider, MD  etonogestrel (NEXPLANON) 68 MG IMPL implant 1 each by Subdermal route once.    Historical Provider, MD  hydrochlorothiazide (HYDRODIURIL) 25 MG tablet Take 25 mg by mouth at bedtime.     Historical Provider, MD  levonorgestrel (MIRENA) 20 MCG/24HR IUD 1 each by Intrauterine route once.    Historical Provider, MD  NIFEdipine (PROCARDIA XL/ADALAT-CC) 60 MG 24 hr tablet Take 60 mg by mouth daily.    Historical Provider, MD  ondansetron (ZOFRAN ODT) 4 MG disintegrating tablet Take 1 tablet (4 mg total) by mouth every 8 (eight) hours as needed for nausea or vomiting. 05/01/15   Dorie Rank, MD  oxyCODONE-acetaminophen (PERCOCET/ROXICET) 5-325 MG tablet Take 1 tablet by mouth every 6 (six) hours as needed. 05/01/15   Dorie Rank, MD   BP 127/68 mmHg  Pulse 76  Temp(Src) 98.3 F (36.8 C) (Oral)  Resp 18  SpO2 98%    Physical Exam  Constitutional: She is oriented to person, place, and time. She  appears well-developed and well-nourished.  HENT:  Head: Normocephalic and atraumatic.  Neck: Normal range of motion. Carotid bruit is not present.  Cardiovascular: Normal rate, regular rhythm, normal heart sounds and intact distal pulses.  Exam reveals no gallop and no friction rub.   No murmur heard. Pulmonary/Chest: Effort normal and breath sounds normal. No respiratory distress. She has no wheezes. She has no rales. She exhibits no tenderness.  Abdominal: Soft. There is no tenderness.  Musculoskeletal: Normal range of motion. She exhibits tenderness.  Cap refill on the right is less than 2 seconds; FROM of the right wrist and elbow; right shoulder is not hot; no  evidence of dislocation; good ROM of right shoulder with discomfort; no pain or deformity of the clavicle; pain to palpation of both the anterior and posterior shoulder  Neurological: She is alert and oriented to person, place, and time.  Skin: Skin is warm and dry. She is not diaphoretic.  Psychiatric: She has a normal mood and affect. Her behavior is normal.  Nursing note and vitals reviewed.   ED Course  Procedures  DIAGNOSTIC STUDIES: Oxygen Saturation is 98% on RA, normal by my interpretation.    COORDINATION OF CARE: 7:41 PM Pt presents today due to right shoulder pain. Discussed next steps with pt including right shoulder x-ray and an orthopaedic referral. She agreed to the plan.   Labs Review Labs Reviewed - No data to display  Imaging Review No results found. I have personally reviewed and evaluated these images and lab results as part of my medical decision-making.   EKG Interpretation None      MDM  Xray of the right shoulder is negative for fracture or dislocation. There is a tiny osteochondral fragment in the biceps recess, but no significant abnormality.   Final diagnoses:  Shoulder pain, right    *I have reviewed nursing notes, vital signs, and all appropriate lab and imaging results for this patient.**  **I personally performed the services described in this documentation, which was scribed in my presence. The recorded information has been reviewed and is accurate.   Lily Kocher, PA-C 08/01/15 [place], MD 08/02/15 [phone]

## 2015-07-30 NOTE — ED Notes (Signed)
Waiting for "shot time" to be up.

## 2015-07-30 NOTE — ED Notes (Signed)
Pt has been treated with anti-inflammatory and steroids for arm pain, dx with bursitis, states pain is worse today

## 2015-07-30 NOTE — Discharge Instructions (Signed)
Your x-ray is negative for fracture or dislocation. Please see Dr. Alvan Dame, or the orthopedic specialist of your choice for orthopedic evaluation concerning your shoulder pain. Please use your sling until seen by orthopedics to assist with your discomfort. Please use Motrin and Decadron daily with food. Use Norco for more severe pain. Norco may cause drowsiness. Please do not take this medication when driving, operating machinery, drinking alcohol, or pertussis pitting in activities that require concentration. Shoulder Pain The shoulder is the joint that connects your arm to your body. Muscles and band-like tissues that connect bones to muscles (tendons) hold the joint together. Shoulder pain is felt if an injury or medical problem affects one or more parts of the shoulder. HOME CARE   Put ice on the sore area.  Put ice in a plastic bag.  Place a towel between your skin and the bag.  Leave the ice on for 15-20 minutes, 03-04 times a day for the first 2 days.  Stop using cold packs if they do not help with the pain.  If you were given something to keep your shoulder from moving (sling; shoulder immobilizer), wear it as told. Only take it off to shower or bathe.  Move your arm as little as possible, but keep your hand moving to prevent puffiness (swelling).  Squeeze a soft ball or foam pad as much as possible to help prevent swelling.  Take medicine as told by your doctor. GET HELP IF:  You have progressing new pain in your arm, hand, or fingers.  Your hand or fingers get cold.  Your medicine does not help lessen your pain. GET HELP RIGHT AWAY IF:   Your arm, hand, or fingers are numb or tingling.  Your arm, hand, or fingers are puffy (swollen), painful, or turn white or blue. MAKE SURE YOU:   Understand these instructions.  Will watch your condition.  Will get help right away if you are not doing well or get worse.   This information is not intended to replace advice given to  you by your health care provider. Make sure you discuss any questions you have with your health care provider.   Document Released: 01/01/2008 Document Revised: 08/05/2014 Document Reviewed: 11/07/2014 Elsevier Interactive Patient Education [employer].

## 2015-09-01 ENCOUNTER — Encounter (HOSPITAL_COMMUNITY): Payer: Self-pay

## 2015-09-01 ENCOUNTER — Emergency Department (HOSPITAL_COMMUNITY)
Admission: EM | Admit: 2015-09-01 | Discharge: 2015-09-01 | Disposition: A | Discharge status: Home / Self Care | Payer: BC Managed Care – PPO | Attending: Emergency Medicine | Admitting: Emergency Medicine

## 2015-09-01 ENCOUNTER — Emergency Department (HOSPITAL_COMMUNITY): Payer: BC Managed Care – PPO

## 2015-09-01 DIAGNOSIS — R55 Syncope and collapse: Secondary | ICD-10-CM | POA: Diagnosis present

## 2015-09-01 DIAGNOSIS — R112 Nausea with vomiting, unspecified: Secondary | ICD-10-CM | POA: Insufficient documentation

## 2015-09-01 DIAGNOSIS — R079 Chest pain, unspecified: Secondary | ICD-10-CM | POA: Insufficient documentation

## 2015-09-01 DIAGNOSIS — Z79899 Other long term (current) drug therapy: Secondary | ICD-10-CM | POA: Insufficient documentation

## 2015-09-01 DIAGNOSIS — R21 Rash and other nonspecific skin eruption: Secondary | ICD-10-CM | POA: Insufficient documentation

## 2015-09-01 DIAGNOSIS — I10 Essential (primary) hypertension: Secondary | ICD-10-CM | POA: Diagnosis not present

## 2015-09-01 DIAGNOSIS — Z3202 Encounter for pregnancy test, result negative: Secondary | ICD-10-CM | POA: Diagnosis not present

## 2015-09-01 LAB — CBC
HCT: 45.6 % (ref 36.0–46.0)
HEMOGLOBIN: 14.9 g/dL (ref 12.0–15.0)
MCH: 25.4 pg — ABNORMAL LOW (ref 26.0–34.0)
MCHC: 32.7 g/dL (ref 30.0–36.0)
MCV: 77.8 fL — ABNORMAL LOW (ref 78.0–100.0)
Platelets: 242 10*3/uL (ref 150–400)
RBC: 5.86 MIL/uL — AB (ref 3.87–5.11)
RDW: 14.3 % (ref 11.5–15.5)
WBC: 8.6 10*3/uL (ref 4.0–10.5)

## 2015-09-01 LAB — BASIC METABOLIC PANEL
ANION GAP: 11 (ref 5–15)
BUN: 17 mg/dL (ref 6–20)
CALCIUM: 9.3 mg/dL (ref 8.9–10.3)
CHLORIDE: 102 mmol/L (ref 101–111)
CO2: 26 mmol/L (ref 22–32)
Creatinine, Ser: 0.98 mg/dL (ref 0.44–1.00)
GFR calc non Af Amer: >60 mL/min (ref >60)
GLUCOSE: 181 mg/dL — AB (ref 65–99)
Potassium: 2.9 mmol/L — ABNORMAL LOW (ref 3.5–5.1)
Sodium: 139 mmol/L (ref 135–145)

## 2015-09-01 LAB — I-STAT TROPONIN, ED: TROPONIN I, POC: 0 ng/mL (ref 0.00–0.08)

## 2015-09-01 LAB — I-STAT BETA HCG BLOOD, ED (MC, WL, AP ONLY): I-stat hCG, quantitative: <5 m[IU]/mL (ref <5)

## 2015-09-01 MED ORDER — POTASSIUM CHLORIDE CRYS ER 20 MEQ PO TBCR
40.0000 meq | EXTENDED_RELEASE_TABLET | Freq: Once | ORAL | Status: AC
  Administered 2015-09-01: 40 meq via ORAL
  Filled 2015-09-01: qty 2

## 2015-09-01 NOTE — ED Provider Notes (Signed)
CSN: [id number]     Arrival date & time 09/01/15  1310 History   First MD Initiated Contact with Patient 09/01/15 1616     Chief Complaint  Patient presents with  . Near Syncope     (Consider location/radiation/quality/duration/timing/severity/associated sxs/prior Treatment) Patient is a 35 y.o. female presenting with near-syncope. The history is provided by the patient.  Near Syncope This is a recurrent problem. The current episode started in the past 7 days. The problem occurs intermittently. The problem has been unchanged. Associated symptoms include chest pain (chest burning), nausea and vomiting. Pertinent negatives include no abdominal pain, coughing, fever or headaches. Nothing aggravates the symptoms. She has tried nothing for the symptoms. The treatment provided no relief.    Past Medical History  Diagnosis Date  . Hypertension    Past Surgical History  Procedure Laterality Date  . Cesarean section    . Tonsillectomy    . Ear tubes     History reviewed. No pertinent family history. Social History  Substance Use Topics  . Smoking status: Never Smoker   . Smokeless tobacco: None  . Alcohol Use: No   OB History    Gravida Para Term Preterm AB TAB SAB Ectopic Multiple Living   2 2             Review of Systems  Constitutional: Negative for fever.  HENT: Negative.   Eyes: Negative for visual disturbance.  Respiratory: Negative for cough and shortness of breath.   Cardiovascular: Positive for chest pain (chest burning) and near-syncope.  Gastrointestinal: Positive for nausea and vomiting. Negative for abdominal pain.  Genitourinary: Negative.   Musculoskeletal: Negative.   Skin: Negative.   Neurological: Negative for headaches.      Allergies  Aspirin  Home Medications   Prior to Admission medications   Medication Sig Start Date End Date Taking? Authorizing Provider  acetaminophen (TYLENOL) 500 MG tablet Take 500 mg by mouth daily as needed. For pain   Yes  Historical Provider, MD  etonogestrel (NEXPLANON) 68 MG IMPL implant 1 each by Subdermal route once. 04/26/2015   Yes Historical Provider, MD  hydrochlorothiazide (HYDRODIURIL) 25 MG tablet Take 25 mg by mouth at bedtime.    Yes Historical Provider, MD  NIFEdipine (PROCARDIA XL/ADALAT-CC) 60 MG 24 hr tablet Take 60 mg by mouth daily.   Yes Historical Provider, MD  PARoxetine (PAXIL) 10 MG tablet Take 10 mg by mouth daily. 08/30/15  Yes Historical Provider, MD  Vitamin D, Ergocalciferol, (DRISDOL) 50000 units CAPS capsule Take 50,000 Units by mouth every 7 (seven) days. Wednesday   Yes Historical Provider, MD   BP 117/79 mmHg  Pulse 72  Temp(Src) 98.5 F (36.9 C) (Oral)  Resp 24  Ht 5\' 5"  (1.651 m)  Wt 117.935 kg  BMI 43.27 kg/m2  SpO2 100% Physical Exam  Constitutional: She is oriented to person, place, and time. She appears well-developed and well-nourished. No distress.  HENT:  Head: Normocephalic and atraumatic.  Eyes: Conjunctivae and EOM are normal. Pupils are equal, round, and reactive to light. No scleral icterus.  Neck: Normal range of motion. Neck supple.  No bruits  Cardiovascular: Normal rate, regular rhythm, normal heart sounds and intact distal pulses.   Pulmonary/Chest: Effort normal and breath sounds normal.  Abdominal: Soft. She exhibits no distension. There is no tenderness. There is no rebound and no guarding.  Musculoskeletal: Normal range of motion. She exhibits no edema or tenderness.  Neurological: She is alert and oriented to person, place,  and time. No cranial nerve deficit. She exhibits normal muscle tone. Coordination normal.  Skin: Skin is warm and dry. Rash noted. She is not diaphoretic. No erythema. No pallor.  Nursing note and vitals reviewed.   ED Course  Procedures (including critical care time) Labs Review Labs Reviewed  BASIC METABOLIC PANEL - Abnormal; Notable for the following:    Potassium 2.9 (*)    Glucose, Bld 181 (*)    All other components  within normal limits  CBC - Abnormal; Notable for the following:    RBC 5.86 (*)    MCV 77.8 (*)    MCH 25.4 (*)    All other components within normal limits  I-STAT TROPOININ, ED  I-STAT BETA HCG BLOOD, ED ([facility], [facility], [facility] ONLY)    Imaging Review Dg Chest 2 View  09/01/2015  CLINICAL DATA:  Chest pain. EXAM: CHEST  2 VIEW COMPARISON:  October 14, 2014. FINDINGS: The heart size and mediastinal contours are within normal limits. Both lungs are clear. No pneumothorax or pleural effusion is noted. The visualized skeletal structures are unremarkable. IMPRESSION: No active cardiopulmonary disease. Electronically Signed   By: Marijo Conception, M.D.   On: 09/01/2015 14:08   I have personally reviewed and evaluated these images and lab results as part of my medical decision-making.   EKG Interpretation   Date/Time:  Friday September 01 2015 13:32:28 EST Ventricular Rate:  76 PR Interval:  140 QRS Duration: 76 QT Interval:  382 QTC Calculation: 429 R Axis:   31 Text Interpretation:  Normal sinus rhythm with sinus arrhythmia  Nonspecific T wave abnormality Abnormal ECG Confirmed by ZAVITZ  MD,  JOSHUA ([id number]) on 09/01/2015 4:59:12 PM      MDM   Final diagnoses:  Near syncope    Patient is 35 year old female who presents today for evaluation of presyncope intermittently for the past week. She was recently started on Paxil by her PCP for concerns of anxiety. She was supposed to get an EKG and echo today but came in because of an episode where she felt hot and nauseous and feeling that she was going to pass out. Further history and exam as above notable for stable vital signs and reassuring physical exam. Labs revealed no leukocytosis, troponin negative, and she is mildly hypokalemic. She was given oral potassium replacement. Chest x-ray and EKG reassuring. Doubt significant cardiac, infectious, neurologic etiology at this time. Patient advised to maintain a Journal when she feels symptomatic compared to  what she is doing at that time  I have reviewed all labs and imaging. Patient stable for discharge home.  I have reviewed all results with the patient. Advised to f/u with pcp in 2-3d. Patient agrees to stated plan. All questions answered. Advised to call or return to have any questions, new symptoms, change in symptoms, or symptoms that they do not understand.     Heriberto Antigua, MD 09/02/15 [id number]  Elnora Morrison, MD 09/02/15 2250

## 2015-09-01 NOTE — Discharge Instructions (Signed)

## 2015-09-01 NOTE — ED Notes (Signed)
Pt presents with 1 week h/o palpitations, shortness of breath, chest heaviness.  Pt seen at PCP, was scheduled for echo today but it wasn't done.  Pt reports chest heaviness is intermittent, reports 2 day h/o nausea, reports feeling of indigestion.

## 2015-12-07 ENCOUNTER — Emergency Department (HOSPITAL_COMMUNITY)
Admission: EM | Admit: 2015-12-07 | Discharge: 2015-12-07 | Disposition: A | Discharge status: Home / Self Care | Payer: BC Managed Care – PPO | Attending: Emergency Medicine | Admitting: Emergency Medicine

## 2015-12-07 ENCOUNTER — Encounter (HOSPITAL_COMMUNITY): Payer: Self-pay | Admitting: *Deleted

## 2015-12-07 ENCOUNTER — Emergency Department (HOSPITAL_COMMUNITY): Payer: BC Managed Care – PPO

## 2015-12-07 DIAGNOSIS — J0100 Acute maxillary sinusitis, unspecified: Secondary | ICD-10-CM | POA: Diagnosis not present

## 2015-12-07 DIAGNOSIS — I10 Essential (primary) hypertension: Secondary | ICD-10-CM | POA: Insufficient documentation

## 2015-12-07 DIAGNOSIS — J40 Bronchitis, not specified as acute or chronic: Secondary | ICD-10-CM | POA: Insufficient documentation

## 2015-12-07 DIAGNOSIS — R05 Cough: Secondary | ICD-10-CM | POA: Diagnosis present

## 2015-12-07 MED ORDER — AMOXICILLIN-POT CLAVULANATE 875-125 MG PO TABS: 1.0000 | ORAL_TABLET | Freq: Two times a day (BID) | ORAL | Status: DC

## 2015-12-07 MED ORDER — BENZONATATE 100 MG PO CAPS: 100.0000 mg | ORAL_CAPSULE | Freq: Three times a day (TID) | ORAL | Status: DC

## 2015-12-07 NOTE — ED Notes (Signed)
Patient verbalizes understanding of discharge instructions, prescriptions, home care and follow up care. Patient out of department at this time. 

## 2015-12-07 NOTE — ED Notes (Signed)
Pt states she started out with nasal congestion that has now progressed to a dry cough

## 2015-12-07 NOTE — Discharge Instructions (Signed)
Acute Bronchitis Bronchitis is inflammation of the airways that extend from the windpipe into the lungs (bronchi). The inflammation often causes mucus to develop. This leads to a cough, which is the most common symptom of bronchitis.  In acute bronchitis, the condition usually develops suddenly and goes away over time, usually in a couple weeks. Smoking, allergies, and asthma can make bronchitis worse. Repeated episodes of bronchitis may cause further lung problems.  CAUSES Acute bronchitis is most often caused by the same virus that causes a cold. The virus can spread from person to person (contagious) through coughing, sneezing, and touching contaminated objects. SIGNS AND SYMPTOMS   Cough.   Fever.   Coughing up mucus.   Body aches.   Chest congestion.   Chills.   Shortness of breath.   Sore throat.  DIAGNOSIS  Acute bronchitis is usually diagnosed through a physical exam. Your health care provider will also ask you questions about your medical history. Tests, such as chest X-rays, are sometimes done to rule out other conditions.  TREATMENT  Acute bronchitis usually goes away in a couple weeks. Oftentimes, no medical treatment is necessary. Medicines are sometimes given for relief of fever or cough. Antibiotic medicines are usually not needed but may be prescribed in certain situations. In some cases, an inhaler may be recommended to help reduce shortness of breath and control the cough. A cool mist vaporizer may also be used to help thin bronchial secretions and make it easier to clear the chest.  HOME CARE INSTRUCTIONS  Get plenty of rest.   Drink enough fluids to keep your urine clear or pale yellow (unless you have a medical condition that requires fluid restriction). Increasing fluids may help thin your respiratory secretions (sputum) and reduce chest congestion, and it will prevent dehydration.   Take medicines only as directed by your health care provider.  If  you were prescribed an antibiotic medicine, finish it all even if you start to feel better.  Avoid smoking and secondhand smoke. Exposure to cigarette smoke or irritating chemicals will make bronchitis worse. If you are a smoker, consider using nicotine gum or skin patches to help control withdrawal symptoms. Quitting smoking will help your lungs heal faster.   Reduce the chances of another bout of acute bronchitis by washing your hands frequently, avoiding people with cold symptoms, and trying not to touch your hands to your mouth, nose, or eyes.   Keep all follow-up visits as directed by your health care provider.  SEEK MEDICAL CARE IF: Your symptoms do not improve after 1 week of treatment.  SEEK IMMEDIATE MEDICAL CARE IF:  You develop an increased fever or chills.   You have chest pain.   You have severe shortness of breath.  You have bloody sputum.   You develop dehydration.  You faint or repeatedly feel like you are going to pass out.  You develop repeated vomiting.  You develop a severe headache. MAKE SURE YOU:   Understand these instructions.  Will watch your condition.  Will get help right away if you are not doing well or get worse.   This information is not intended to replace advice given to you by your health care provider. Make sure you discuss any questions you have with your health care provider.   Document Released: 08/22/2004 Document Revised: 08/05/2014 Document Reviewed: 01/05/2013 Elsevier Interactive Patient Education 2016 Else[employer] Sinusitis, Adult Sinusitis is redness, soreness, and inflammation of the paranasal sinuses. Paranasal sinuses are air pockets within the   bones of your face. They are located beneath your eyes, in the middle of your forehead, and above your eyes. In healthy paranasal sinuses, mucus is able to drain out, and air is able to circulate through them by way of your nose. However, when your paranasal sinuses are inflamed,  mucus and air can become trapped. This can allow bacteria and other germs to grow and cause infection. Sinusitis can develop quickly and last only a short time (acute) or continue over a long period (chronic). Sinusitis that lasts for more than 12 weeks is considered chronic. CAUSES Causes of sinusitis include:  Allergies.  Structural abnormalities, such as displacement of the cartilage that separates your nostrils (deviated septum), which can decrease the air flow through your nose and sinuses and affect sinus drainage.  Functional abnormalities, such as when the small hairs (cilia) that line your sinuses and help remove mucus do not work properly or are not present. SIGNS AND SYMPTOMS Symptoms of acute and chronic sinusitis are the same. The primary symptoms are pain and pressure around the affected sinuses. Other symptoms include:  Upper toothache.  Earache.  Headache.  Bad breath.  Decreased sense of smell and taste.  A cough, which worsens when you are lying flat.  Fatigue.  Fever.  Thick drainage from your nose, which often is green and may contain pus (purulent).  Swelling and warmth over the affected sinuses. DIAGNOSIS Your health care provider will perform a physical exam. During your exam, your health care provider may perform any of the following to help determine if you have acute sinusitis or chronic sinusitis:  Look in your nose for signs of abnormal growths in your nostrils (nasal polyps).  Tap over the affected sinus to check for signs of infection.  View the inside of your sinuses using an imaging device that has a light attached (endoscope). If your health care provider suspects that you have chronic sinusitis, one or more of the following tests may be recommended:  Allergy tests.  Nasal culture. A sample of mucus is taken from your nose, sent to a lab, and screened for bacteria.  Nasal cytology. A sample of mucus is taken from your nose and examined by  your health care provider to determine if your sinusitis is related to an allergy. TREATMENT Most cases of acute sinusitis are related to a viral infection and will resolve on their own within 10 days. Sometimes, medicines are prescribed to help relieve symptoms of both acute and chronic sinusitis. These may include pain medicines, decongestants, nasal steroid sprays, or saline sprays. However, for sinusitis related to a bacterial infection, your health care provider will prescribe antibiotic medicines. These are medicines that will help kill the bacteria causing the infection. Rarely, sinusitis is caused by a fungal infection. In these cases, your health care provider will prescribe antifungal medicine. For some cases of chronic sinusitis, surgery is needed. Generally, these are cases in which sinusitis recurs more than 3 times per year, despite other treatments. HOME CARE INSTRUCTIONS  Drink plenty of water. Water helps thin the mucus so your sinuses can drain more easily.  Use a humidifier.  Inhale steam 3-4 times a day (for example, sit in the bathroom with the shower running).  Apply a warm, moist washcloth to your face 3-4 times a day, or as directed by your health care provider.  Use saline nasal sprays to help moisten and clean your sinuses.  Take medicines only as directed by your health care provider.  If   you were prescribed either an antibiotic or antifungal medicine, finish it all even if you start to feel better. SEEK IMMEDIATE MEDICAL CARE IF:  You have increasing pain or severe headaches.  You have nausea, vomiting, or drowsiness.  You have swelling around your face.  You have vision problems.  You have a stiff neck.  You have difficulty breathing.   This information is not intended to replace advice given to you by your health care provider. Make sure you discuss any questions you have with your health care provider.   Document Released: 07/15/2005 Document  Revised: 08/05/2014 Document Reviewed: 07/30/2011 Elsevier Interactive Patient Education 2016 Elsevier Inc.  

## 2015-12-07 NOTE — ED Provider Notes (Signed)
CSN: [id number]     Arrival date & time 12/07/15  1945 History   First MD Initiated Contact with Patient 12/07/15 2044     Chief Complaint  Patient presents with  . Cough     (Consider location/radiation/quality/duration/timing/severity/associated sxs/prior Treatment) Patient is a 35 y.o. female presenting with cough. The history is provided by the patient. No language interpreter was used.  Cough Cough characteristics:  Productive Sputum characteristics:  Nondescript Severity:  Moderate Onset quality:  Gradual Duration:  4 days Timing:  Constant Progression:  Worsening Chronicity:  New Smoker: no   Context: upper respiratory infection   Relieved by:  Nothing Worsened by:  Nothing tried Ineffective treatments:  None tried Risk factors: no recent infection     Past Medical History  Diagnosis Date  . Hypertension    Past Surgical History  Procedure Laterality Date  . Cesarean section    . Tonsillectomy    . Ear tubes     History reviewed. No pertinent family history. Social History  Substance Use Topics  . Smoking status: Never Smoker   . Smokeless tobacco: None  . Alcohol Use: No   OB History    Gravida Para Term Preterm AB TAB SAB Ectopic Multiple Living   2 2             Review of Systems  Respiratory: Positive for cough.   All other systems reviewed and are negative.     Allergies  Aspirin  Home Medications   Prior to Admission medications   Medication Sig Start Date End Date Taking? Authorizing Provider  acetaminophen (TYLENOL) 500 MG tablet Take 500 mg by mouth daily as needed. For pain    Historical Provider, MD  amoxicillin-clavulanate (AUGMENTIN) 875-125 MG tablet Take 1 tablet by mouth every 12 (twelve) hours. 12/07/15   Fransico Meadow, PA-C  benzonatate (TESSALON) 100 MG capsule Take 1 capsule (100 mg total) by mouth every 8 (eight) hours. 12/07/15   Fransico Meadow, PA-C  etonogestrel (NEXPLANON) 68 MG IMPL implant 1 each by Subdermal route  once. 04/26/2015    Historical Provider, MD  hydrochlorothiazide (HYDRODIURIL) 25 MG tablet Take 25 mg by mouth at bedtime.     Historical Provider, MD  NIFEdipine (PROCARDIA XL/ADALAT-CC) 60 MG 24 hr tablet Take 60 mg by mouth daily.    Historical Provider, MD  PARoxetine (PAXIL) 10 MG tablet Take 10 mg by mouth daily. 08/30/15   Historical Provider, MD  Vitamin D, Ergocalciferol, (DRISDOL) 50000 units CAPS capsule Take 50,000 Units by mouth every 7 (seven) days. Wednesday    Historical Provider, MD   BP 176/94 mmHg  Pulse 89  Temp(Src) 98.5 F (36.9 C) (Oral)  Resp 18  SpO2 100% Physical Exam  Constitutional: She is oriented to person, place, and time. She appears well-developed and well-nourished.  HENT:  Head: Normocephalic and atraumatic.  Right Ear: External ear normal.  Left Ear: External ear normal.  Mouth/Throat: Posterior oropharyngeal erythema present.  Tender maxillary sinuses,  Eyes: Conjunctivae and EOM are normal. Pupils are equal, round, and reactive to light.  Neck: Normal range of motion. Neck supple.  Cardiovascular: Normal rate.   Pulmonary/Chest: Effort normal.  Abdominal: Soft. She exhibits no distension.  Musculoskeletal: Normal range of motion.  Neurological: She is alert and oriented to person, place, and time.  Skin: Skin is warm and dry.  Psychiatric: She has a normal mood and affect.  Nursing note and vitals reviewed.   ED Course  Procedures (including  critical care time) Labs Review Labs Reviewed - No data to display  Imaging Review Dg Chest 2 View  12/07/2015  CLINICAL DATA:  Patient with dry cough and nasal congestion. EXAM: CHEST  2 VIEW COMPARISON:  Chest radiograph 09/01/2015. FINDINGS: Stable cardiac and mediastinal contours. No consolidative pulmonary opacities. No pleural effusion or pneumothorax. Regional skeleton is unremarkable. IMPRESSION: No active cardiopulmonary disease. Electronically Signed   By: Lovey Newcomer M.D.   On: 12/07/2015 20:13    I have personally reviewed and evaluated these images and lab results as part of my medical decision-making.   EKG Interpretation None      MDM   Final diagnoses:  Subacute maxillary sinusitis  Bronchitis   An After Visit Summary was printed and given to the patient. Meds ordered this encounter  Medications  . benzonatate (TESSALON) 100 MG capsule    Sig: Take 1 capsule (100 mg total) by mouth every 8 (eight) hours.    Dispense:  21 capsule    Refill:  0    Order Specific Question:  Supervising Provider    Answer:  MILLER, BRIAN [[id number]]  . amoxicillin-clavulanate (AUGMENTIN) 875-125 MG tablet    Sig: Take 1 tablet by mouth every 12 (twelve) hours.    Dispense:  20 tablet    Refill:  0    Order Specific Question:  Supervising Provider    Answer:  Noemi Chapel [[id number]]      Hollace Kinnier [place], PA-C 12/07/15 2237  Merrily Pew, MD 12/07/15 [phone]

## 2015-12-27 ENCOUNTER — Emergency Department (HOSPITAL_COMMUNITY)
Admission: EM | Admit: 2015-12-27 | Discharge: 2015-12-27 | Disposition: A | Discharge status: Home / Self Care | Payer: BC Managed Care – PPO | Attending: Emergency Medicine | Admitting: Emergency Medicine

## 2015-12-27 ENCOUNTER — Emergency Department (HOSPITAL_COMMUNITY): Payer: BC Managed Care – PPO

## 2015-12-27 ENCOUNTER — Encounter (HOSPITAL_COMMUNITY): Payer: Self-pay | Admitting: Emergency Medicine

## 2015-12-27 DIAGNOSIS — R079 Chest pain, unspecified: Secondary | ICD-10-CM | POA: Insufficient documentation

## 2015-12-27 DIAGNOSIS — I10 Essential (primary) hypertension: Secondary | ICD-10-CM | POA: Diagnosis not present

## 2015-12-27 DIAGNOSIS — R509 Fever, unspecified: Secondary | ICD-10-CM | POA: Diagnosis present

## 2015-12-27 DIAGNOSIS — Z79899 Other long term (current) drug therapy: Secondary | ICD-10-CM | POA: Insufficient documentation

## 2015-12-27 DIAGNOSIS — B349 Viral infection, unspecified: Secondary | ICD-10-CM

## 2015-12-27 LAB — URINALYSIS, ROUTINE W REFLEX MICROSCOPIC
Bilirubin Urine: NEGATIVE
GLUCOSE, UA: NEGATIVE mg/dL
KETONES UR: NEGATIVE mg/dL
LEUKOCYTES UA: NEGATIVE
Nitrite: NEGATIVE
PROTEIN: NEGATIVE mg/dL
Specific Gravity, Urine: 1.02 (ref 1.005–1.030)
pH: 5.5 (ref 5.0–8.0)

## 2015-12-27 LAB — URINE MICROSCOPIC-ADD ON

## 2015-12-27 LAB — RAPID STREP SCREEN (MED CTR MEBANE ONLY): Streptococcus, Group A Screen (Direct): NEGATIVE

## 2015-12-27 NOTE — ED Notes (Signed)
Pt c/o fever, chills, and body aches.

## 2015-12-27 NOTE — ED Provider Notes (Signed)
CSN: [id number]     Arrival date & time 12/27/15  0045 History   First MD Initiated Contact with Patient 12/27/15 0245     Chief Complaint  Patient presents with  . Chills     (Consider location/radiation/quality/duration/timing/severity/associated sxs/prior Treatment) HPI Comments: Patient with 3 day history of chills, body aches, headache, sore throat, flank pain. Reports fever to 102 at home. Recently treated for "bronchiolitis" by her PCP. Not currently on antibiotics. Cough is nonproductive. Intermittent headache comes and goes. No chest pain or shortness of breath. No abdominal pain, nausea or vomiting. Has low back pain is concerned that she might have a UTI though denies dysuria or hematuria. Patient treated for sinusitis on May 11 completed 1 week course of amoxicillin. 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  The history is provided by the patient.    Past Medical History  Diagnosis Date  . Hypertension    Past Surgical History  Procedure Laterality Date  . Cesarean section    . Tonsillectomy    . Ear tubes     History reviewed. No pertinent family history. Social History  Substance Use Topics  . Smoking status: Never Smoker   . Smokeless tobacco: None  . Alcohol Use: No   OB History    Gravida Para Term Preterm AB TAB SAB Ectopic Multiple Living   2 2             Review of Systems  Constitutional: Positive for fever, chills, activity change and appetite change.  HENT: Positive for congestion, rhinorrhea and sore throat.   Eyes: Negative for visual disturbance.  Respiratory: Positive for cough. Negative for chest tightness.   Cardiovascular: Positive for chest pain. Negative for leg swelling.  Gastrointestinal: Negative for nausea, vomiting and abdominal pain.  Genitourinary: Negative for dysuria, hematuria, vaginal bleeding and vaginal discharge.  Musculoskeletal: Negative for myalgias and arthralgias.  Skin: Negative for rash.  Neurological: Negative  for dizziness, weakness and headaches.  A complete 10 system review of systems was obtained and all systems are negative except as noted in the HPI and PMH.      Allergies  Aspirin  Home Medications   Prior to Admission medications   Medication Sig Start Date End Date Taking? Authorizing Provider  acetaminophen (TYLENOL) 500 MG tablet Take 500 mg by mouth daily as needed. For pain   Yes Historical Provider, MD  etonogestrel (NEXPLANON) 68 MG IMPL implant 1 each by Subdermal route once. 04/26/2015   Yes Historical Provider, MD  hydrochlorothiazide (HYDRODIURIL) 25 MG tablet Take 25 mg by mouth at bedtime.    Yes Historical Provider, MD  NIFEdipine (PROCARDIA XL/ADALAT-CC) 60 MG 24 hr tablet Take 30 mg by mouth daily.    Yes Historical Provider, MD  PARoxetine (PAXIL) 10 MG tablet Take 10 mg by mouth daily. 08/30/15  Yes Historical Provider, MD  Vitamin D, Ergocalciferol, (DRISDOL) 50000 units CAPS capsule Take 50,000 Units by mouth every 7 (seven) days. Wednesday   Yes Historical Provider, MD  amoxicillin-clavulanate (AUGMENTIN) 875-125 MG tablet Take 1 tablet by mouth every 12 (twelve) hours. 12/07/15   Fransico Meadow, PA-C  benzonatate (TESSALON) 100 MG capsule Take 1 capsule (100 mg total) by mouth every 8 (eight) hours. 12/07/15   Fransico Meadow, PA-C   BP 111/74 mmHg  Pulse 88  Temp(Src) 98.4 F (36.9 C) (Oral)  Resp 18  Ht 5\' 5"  (1.651 m)  Wt 260 lb (117.935 kg)  BMI 43.27 kg/m2  SpO2 97% Physical Exam  Constitutional: She is oriented to person, place, and time. She appears well-developed and well-nourished. No distress.  HENT:  Head: Normocephalic and atraumatic.  Mouth/Throat: Oropharynx is clear and moist. No oropharyngeal exudate.  Eyes: Conjunctivae and EOM are normal. Pupils are equal, round, and reactive to light.  Neck: Normal range of motion. Neck supple.  No meningismus.  Cardiovascular: Normal rate, regular rhythm, normal heart sounds and intact distal pulses.   No  murmur heard. Pulmonary/Chest: Effort normal and breath sounds normal. No respiratory distress.  Abdominal: Soft. There is no tenderness. There is no rebound and no guarding.  Musculoskeletal: Normal range of motion. She exhibits no edema or tenderness.  Paraspinal lumbar tenderness  Neurological: She is alert and oriented to person, place, and time. No cranial nerve deficit. She exhibits normal muscle tone. Coordination normal.  No ataxia on finger to nose bilaterally. No pronator drift. 5/5 strength throughout. CN 2-12 intact.Equal grip strength. Sensation intact.   Skin: Skin is warm.  Psychiatric: She has a normal mood and affect. Her behavior is normal.  Nursing note and vitals reviewed.   ED Course  Procedures (including critical care time) Labs Review Labs Reviewed  URINALYSIS, ROUTINE W REFLEX MICROSCOPIC (NOT AT [facility]) - Abnormal; Notable for the following:    Hgb urine dipstick TRACE (*)    All other components within normal limits  URINE MICROSCOPIC-ADD ON - Abnormal; Notable for the following:    Squamous Epithelial / LPF 6-30 (*)    Bacteria, UA MANY (*)    All other components within normal limits  RAPID STREP SCREEN (NOT AT [facility])  CULTURE, GROUP A STREP [facility])  URINE CULTURE    Imaging Review Dg Chest 2 View  12/27/2015  CLINICAL DATA:  Cough, fever, chills and body aches tonight. EXAM: CHEST  2 VIEW COMPARISON:  12/07/2015 FINDINGS: The cardiomediastinal contours are normal. The lungs are clear. Pulmonary vasculature is normal. No consolidation, pleural effusion, or pneumothorax. No acute osseous abnormalities are seen. IMPRESSION: No acute pulmonary process. Electronically Signed   By: Jeb Levering M.D.   On: 12/27/2015 03:50   I have personally reviewed and evaluated these images and lab results as part of my medical decision-making.   EKG Interpretation None      MDM   Final diagnoses:  Viral syndrome   3 days of fever, body aches, headache, sore  throat. Well-appearing. Afebrile, no meningismus.  CXR negative. Rapid strep and UA negative.  Well appearing. Suspect viral syndrome.  No evidence of pneumonia.  PO fluids, antipyretics, follow up with PCP, continue supportive care. Return precautions discussed.   Ezequiel Essex, MD 12/27/15 [phone]

## 2015-12-27 NOTE — ED Notes (Signed)
Pt given ginger ale to drink and encouraged to drink

## 2015-12-27 NOTE — Discharge Instructions (Signed)
Viral Infections Your chest xray, UA and strep test are negative. Keep herself hydrated. Use Tylenol or Motrin as needed for fever and body aches. Return to the ED if you develop new or worsening symptoms. A viral infection can be caused by different types of viruses.Most viral infections are not serious and resolve on their own. However, some infections may cause severe symptoms and may lead to further complications. SYMPTOMS Viruses can frequently cause:  Minor sore throat.  Aches and pains.  Headaches.  Runny nose.  Different types of rashes.  Watery eyes.  Tiredness.  Cough.  Loss of appetite.  Gastrointestinal infections, resulting in nausea, vomiting, and diarrhea. These symptoms do not respond to antibiotics because the infection is not caused by bacteria. However, you might catch a bacterial infection following the viral infection. This is sometimes called a "superinfection." Symptoms of such a bacterial infection may include:  Worsening sore throat with pus and difficulty swallowing.  Swollen neck glands.  Chills and a high or persistent fever.  Severe headache.  Tenderness over the sinuses.  Persistent overall ill feeling (malaise), muscle aches, and tiredness (fatigue).  Persistent cough.  Yellow, green, or brown mucus production with coughing. HOME CARE INSTRUCTIONS   Only take over-the-counter or prescription medicines for pain, discomfort, diarrhea, or fever as directed by your caregiver.  Drink enough water and fluids to keep your urine clear or pale yellow. Sports drinks can provide valuable electrolytes, sugars, and hydration.  Get plenty of rest and maintain proper nutrition. Soups and broths with crackers or rice are fine. SEEK IMMEDIATE MEDICAL CARE IF:   You have severe headaches, shortness of breath, chest pain, neck pain, or an unusual rash.  You have uncontrolled vomiting, diarrhea, or you are unable to keep down fluids.  You or your  child has an oral temperature above 102 F (38.9 C), not controlled by medicine.  Your baby is older than 3 months with a rectal temperature of 102 F (38.9 C) or higher.  Your baby is 10 months old or younger with a rectal temperature of 100.4 F (38 C) or higher. MAKE SURE YOU:   Understand these instructions.  Will watch your condition.  Will get help right away if you are not doing well or get worse.   This information is not intended to replace advice given to you by your health care provider. Make sure you discuss any questions you have with your health care provider.   Document Released: 04/24/2005 Document Revised: 10/07/2011 Document Reviewed: 12/21/2014 Elsevier Interactive Patient Education [employer].

## 2015-12-28 LAB — URINE CULTURE

## 2015-12-29 LAB — CULTURE, GROUP A STREP (THRC)

## 2016-07-11 ENCOUNTER — Emergency Department (HOSPITAL_COMMUNITY)
Admission: EM | Admit: 2016-07-11 | Discharge: 2016-07-11 | Disposition: A | Discharge status: Home / Self Care | Payer: BC Managed Care – PPO | Attending: Emergency Medicine | Admitting: Emergency Medicine

## 2016-07-11 ENCOUNTER — Encounter (HOSPITAL_COMMUNITY): Payer: Self-pay | Admitting: Emergency Medicine

## 2016-07-11 ENCOUNTER — Emergency Department (HOSPITAL_COMMUNITY): Payer: BC Managed Care – PPO

## 2016-07-11 DIAGNOSIS — I10 Essential (primary) hypertension: Secondary | ICD-10-CM | POA: Insufficient documentation

## 2016-07-11 DIAGNOSIS — Z79899 Other long term (current) drug therapy: Secondary | ICD-10-CM | POA: Diagnosis not present

## 2016-07-11 DIAGNOSIS — J40 Bronchitis, not specified as acute or chronic: Secondary | ICD-10-CM | POA: Diagnosis not present

## 2016-07-11 DIAGNOSIS — R05 Cough: Secondary | ICD-10-CM | POA: Diagnosis present

## 2016-07-11 LAB — RAPID STREP SCREEN (MED CTR MEBANE ONLY): STREPTOCOCCUS, GROUP A SCREEN (DIRECT): NEGATIVE

## 2016-07-11 MED ORDER — AZITHROMYCIN 250 MG PO TABS: 250.0000 mg | ORAL_TABLET | Freq: Every day | ORAL | 0 refills | Status: DC

## 2016-07-11 MED ORDER — ALBUTEROL SULFATE HFA 108 (90 BASE) MCG/ACT IN AERS
2.0000 | INHALATION_SPRAY | RESPIRATORY_TRACT | Status: DC | PRN
  Administered 2016-07-11: 2 via RESPIRATORY_TRACT
  Filled 2016-07-11: qty 6.7

## 2016-07-11 MED ORDER — BENZONATATE 100 MG PO CAPS: 100.0000 mg | ORAL_CAPSULE | Freq: Three times a day (TID) | ORAL | 0 refills | Status: DC

## 2016-07-11 NOTE — ED Provider Notes (Signed)
[facility] DEPT Provider Note    By signing my name below, I, Bea Graff, attest that this documentation has been prepared under the direction and in the presence of [facility], [place]. Electronically Signed: Bea Graff, ED Scribe. 07/11/16. 8:22 PM.    History   Chief Complaint Chief Complaint  Patient presents with  . Cough  . URI   The history is provided by medical records. No language interpreter was used.  Cough  This is a new problem. The current episode started more than 1 week ago. The problem has been gradually worsening. The cough is productive of sputum. Associated symptoms include ear congestion, ear pain, rhinorrhea, sore throat, myalgias and shortness of breath. Pertinent negatives include no wheezing. The treatment provided no relief.  URI   This is a new problem. The current episode started more than 1 week ago. The problem has been gradually worsening. Associated symptoms include congestion, ear pain, plugged ear sensation, rhinorrhea, sore throat and cough. Pertinent negatives include no abdominal pain, no nausea, no vomiting and no wheezing. She has tried nothing for the symptoms. The treatment provided no relief.    HPI Comments:  Deborah Garza is an obese 35 y.o. female who presents to the Emergency Department complaining of symptoms of a URI that began 1.5 weeks ago. She reports thick greenish/yellow nasal congestion and rhinorrhea, bilateral otalgia, intermittent productive cough, generalized body aches due to the cough. She states she has subjective fever some SOB when she coughs. She has taken Benzonatate with no significant relief of her symptoms. She denies modifying factors. She denies abdominal pain, nausea, vomiting, ear drainage.   Past Medical History:  Diagnosis Date  . Hypertension     Patient Active Problem List   Diagnosis Date Noted  . Obesity 12/04/2011  . Contraception management-Mirena IUD 12/04/2011    Past Surgical  History:  Procedure Laterality Date  . CESAREAN SECTION    . ear tubes    . TONSILLECTOMY      OB History    Gravida Para Term Preterm AB Living   2 2           SAB TAB Ectopic Multiple Live Births                   Home Medications    Prior to Admission medications   Medication Sig Start Date End Date Taking? Authorizing Provider  acetaminophen (TYLENOL) 500 MG tablet Take 500 mg by mouth daily as needed. For pain    Historical Provider, MD  amoxicillin-clavulanate (AUGMENTIN) 875-125 MG tablet Take 1 tablet by mouth every 12 (twelve) hours. 12/07/15   Fransico Meadow, PA-C  azithromycin (ZITHROMAX) 250 MG tablet Take 1 tablet (250 mg total) by mouth daily. Take first 2 tablets together, then 1 every day until finished. 07/11/16   Mohamad Bruso Bunnie Pion, NP  benzonatate (TESSALON) 100 MG capsule Take 1 capsule (100 mg total) by mouth every 8 (eight) hours. 07/11/16   Larson Limones Bunnie Pion, NP  etonogestrel (NEXPLANON) 68 MG IMPL implant 1 each by Subdermal route once. 04/26/2015    Historical Provider, MD  hydrochlorothiazide (HYDRODIURIL) 25 MG tablet Take 25 mg by mouth at bedtime.     Historical Provider, MD  NIFEdipine (PROCARDIA XL/ADALAT-CC) 60 MG 24 hr tablet Take 30 mg by mouth daily.     Historical Provider, MD  PARoxetine (PAXIL) 10 MG tablet Take 10 mg by mouth daily. 08/30/15   Historical Provider, MD  Vitamin D, Ergocalciferol, (DRISDOL)  50000 units CAPS capsule Take 50,000 Units by mouth every 7 (seven) days. Wednesday    Historical Provider, MD    Family History History reviewed. No pertinent family history.  Social History Social History  Substance Use Topics  . Smoking status: Never Smoker  . Smokeless tobacco: Not on file  . Alcohol use No     Allergies   Aspirin   Review of Systems Review of Systems  Constitutional: Positive for fever (subjective).  HENT: Positive for congestion, ear pain, rhinorrhea and sore throat. Negative for ear discharge.   Respiratory: Positive  for cough and shortness of breath. Negative for wheezing.   Gastrointestinal: Negative for abdominal pain, nausea and vomiting.  Musculoskeletal: Positive for myalgias.     Physical Exam Updated Vital Signs BP 135/81 (BP Location: Left Arm)   Pulse 80   Temp 98.6 F (37 C) (Oral)   Resp 18   SpO2 100%   Physical Exam  Constitutional: She is oriented to person, place, and time. She appears well-developed and well-nourished. No distress.  HENT:  Head: Normocephalic and atraumatic.  Right Ear: Tympanic membrane and ear canal normal.  Left Ear: Tympanic membrane and ear canal normal.  Nose: Mucosal edema and rhinorrhea present.  Mouth/Throat: Uvula is midline and mucous membranes are normal. Posterior oropharyngeal erythema present. No oropharyngeal exudate, posterior oropharyngeal edema or tonsillar abscesses.  Eyes: Conjunctivae and EOM are normal.  Neck: Normal range of motion. Neck supple.  Cardiovascular: Normal rate and regular rhythm.   Pulmonary/Chest: Effort normal. No respiratory distress. She has decreased breath sounds. She has no wheezes. She has no rhonchi. She has no rales.  Abdominal: Soft. There is no tenderness.  Musculoskeletal: Normal range of motion.  Lymphadenopathy:    She has cervical adenopathy.  Neurological: She is alert and oriented to person, place, and time. No cranial nerve deficit.  Skin: Skin is warm and dry.  Psychiatric: She has a normal mood and affect. Her behavior is normal.  Nursing note and vitals reviewed.    ED Treatments / Results  DIAGNOSTIC STUDIES: Oxygen Saturation is 100% on RA, normal by my interpretation.   COORDINATION OF CARE: 6:57 PM- Will order CXR, rapid strep test and order albuterol MDI. Pt verbalizes understanding and agrees to plan.  Medications  albuterol (PROVENTIL HFA;VENTOLIN HFA) 108 (90 Base) MCG/ACT inhaler 2 puff (2 puffs Inhalation Given 07/11/16 2037)    Labs (all labs ordered are listed, but only  abnormal results are displayed) Labs Reviewed  RAPID STREP SCREEN (NOT AT [facility])  CULTURE, GROUP A STREP [facility])   Radiology Dg Chest 2 View  Result Date: 07/11/2016 CLINICAL DATA:  Patient with cough for 1 week. EXAM: CHEST  2 VIEW COMPARISON:  Chest radiograph 12/27/2015. FINDINGS: The heart size and mediastinal contours are within normal limits. Both lungs are clear. The visualized skeletal structures are unremarkable. IMPRESSION: No active cardiopulmonary disease. Electronically Signed   By: Lovey Newcomer M.D.   On: 07/11/2016 19:56    Procedures Procedures (including critical care time)  Medications Ordered in ED Medications  albuterol (PROVENTIL HFA;VENTOLIN HFA) 108 (90 Base) MCG/ACT inhaler 2 puff (2 puffs Inhalation Given 07/11/16 2037)     Initial Impression / Assessment and Plan / ED Course  I have reviewed the triage vital signs and the nursing notes.  Pertinent labs & imaging results that were available during my care of the patient were reviewed by me and considered in my medical decision making (see chart for details).  Clinical Course     Pt symptoms consistent with Bronchitis. CXR negative for acute infiltrate. Pt will be discharged with Z-pak and cough medication.  Discussed return precautions. Pt with negative strep. Discussed that results of strep culture are pending and patient will be informed if positive result. No evidence of dehydration. Pt is tolerating secretions. Presentation not concerning for peritonsillar abscess or spread of infection to deep spaces of the throat; patent airway. Specific return precautions discussed. Recommended PCP follow up. Pt appears safe for discharge.  I personally performed the services described in this documentation, which was scribed in my presence. The recorded information has been reviewed and is accurate.   Final Clinical Impressions(s) / ED Diagnoses   Final diagnoses:  Bronchitis    New Prescriptions Discharge  Medication List as of 07/11/2016  8:23 PM    START taking these medications   Details  azithromycin (ZITHROMAX) 250 MG tablet Take 1 tablet (250 mg total) by mouth daily. Take first 2 tablets together, then 1 every day until finished., Starting Thu 07/11/2016, [place], NP 07/11/16 [place], MD 07/16/16 [phone]

## 2016-07-11 NOTE — ED Notes (Signed)
Reviewed usage of albuteral MDI and witnessed pt self-administer 2 puffs. Pt departed in NAD, refused use of wheelchair.

## 2016-07-11 NOTE — Discharge Instructions (Signed)
Use the inhaler every 4 hours as needed.follow up with your doctor or return here for worsening symptoms.

## 2016-07-11 NOTE — ED Notes (Signed)
See triage and fps notes who saw before myself

## 2016-07-11 NOTE — ED Notes (Signed)
Pt alert no distress   Throat beta strep to the lab

## 2016-07-11 NOTE — ED Triage Notes (Signed)
Pt sts URI sx and cough and pain with cough x 3days

## 2016-07-11 NOTE — ED Notes (Signed)
See triage and fnps notes they saw the pt  Before myself alert no distress

## 2016-07-14 LAB — CULTURE, GROUP A STREP (THRC)

## 2016-09-11 ENCOUNTER — Other Ambulatory Visit (HOSPITAL_BASED_OUTPATIENT_CLINIC_OR_DEPARTMENT_OTHER): Payer: Self-pay

## 2016-09-11 DIAGNOSIS — R0683 Snoring: Secondary | ICD-10-CM

## 2016-09-15 ENCOUNTER — Ambulatory Visit: Payer: BC Managed Care – PPO | Attending: Family Medicine | Admitting: Internal Medicine

## 2016-09-15 DIAGNOSIS — R0683 Snoring: Secondary | ICD-10-CM | POA: Diagnosis not present

## 2016-10-05 NOTE — Procedures (Signed)
   Patient Name: Deborah Garza, Deborah Garza Date: 09/15/2016 Gender: Female D.O.B: [birth year] Age (years): 35 Referring Provider: Iona Beard Height (inches): 11 Interpreting Physician: Baird Lyons MD, ABSM Weight (lbs): 291 RPSGT: Rosebud Poles BMI: 48 MRN: [id number] Neck Size: CLINICAL INFORMATION Sleep Study Type:  unattended HST     Indication for sleep study: Snoring     Epworth Sleepiness Score: NA  SLEEP STUDY TECHNIQUE A multi-channel overnight portable sleep study was performed. The channels recorded were: nasal airflow, thoracic respiratory movement, and oxygen saturation with a pulse oximetry. Snoring was also monitored.  MEDICATIONS Patient self administered medications include: none reported during study.  SLEEP ARCHITECTURE Patient was studied for 479.5 minutes. The sleep efficiency was 100.0 % and the patient was supine for 0%. The arousal index was 0.5 per hour.  RESPIRATORY PARAMETERS The overall AHI was 1.6 per hour, with a central apnea index of 0.0 per hour.  The oxygen nadir was 86% during sleep.  CARDIAC DATA Mean heart rate during sleep was bpm.  IMPRESSIONS - No significant obstructive sleep apnea occurred during this study (AHI = 1.6/h). - No significant central sleep apnea occurred during this study (CAI = 0.0/h). - Moderate oxygen desaturation was noted during this study (Min O2 = 86%). - No snoring was audible during this study.  DIAGNOSIS - Normal study  RECOMMENDATIONS - Avoid alcohol, sedatives and other CNS depressants that may worsen sleep apnea and disrupt normal sleep architecture. - Sleep hygiene should be reviewed to assess factors that may improve sleep quality. - Weight management and regular exercise should be initiated or continued.  [Electronically signed] 10/05/2016 10:05 AM  Baird Lyons MD, ABSM Diplomate, American Board of Sleep Medicine   NPI: [id number]  [place], American Board of  Sleep Medicine  ELECTRONICALLY SIGNED ON:  10/05/2016, 10:01 AM [place] PH: [phone]   FX: [phone] [place]

## 2017-01-30 ENCOUNTER — Emergency Department (HOSPITAL_COMMUNITY)
Admission: EM | Admit: 2017-01-30 | Discharge: 2017-01-30 | Disposition: A | Discharge status: Home / Self Care | Payer: BC Managed Care – PPO | Attending: Emergency Medicine | Admitting: Emergency Medicine

## 2017-01-30 DIAGNOSIS — M549 Dorsalgia, unspecified: Secondary | ICD-10-CM | POA: Diagnosis present

## 2017-01-30 DIAGNOSIS — Z79899 Other long term (current) drug therapy: Secondary | ICD-10-CM | POA: Diagnosis not present

## 2017-01-30 DIAGNOSIS — N939 Abnormal uterine and vaginal bleeding, unspecified: Secondary | ICD-10-CM | POA: Insufficient documentation

## 2017-01-30 DIAGNOSIS — I10 Essential (primary) hypertension: Secondary | ICD-10-CM | POA: Diagnosis not present

## 2017-01-30 DIAGNOSIS — R109 Unspecified abdominal pain: Secondary | ICD-10-CM | POA: Insufficient documentation

## 2017-01-30 LAB — URINALYSIS, ROUTINE W REFLEX MICROSCOPIC
Bilirubin Urine: NEGATIVE
Glucose, UA: NEGATIVE mg/dL
Ketones, ur: NEGATIVE mg/dL
Leukocytes, UA: NEGATIVE
Nitrite: NEGATIVE
Protein, ur: NEGATIVE mg/dL
SPECIFIC GRAVITY, URINE: 1.026 (ref 1.005–1.030)
pH: 5 (ref 5.0–8.0)

## 2017-01-30 LAB — PREGNANCY, URINE: PREG TEST UR: NEGATIVE

## 2017-01-30 NOTE — ED Provider Notes (Signed)
[facility] DEPT Provider Note   CSN: [id number] Arrival date & time: 01/30/17  1627     History   Chief Complaint Chief Complaint  Patient presents with  . Back Pain  . Vaginal Bleeding    HPI Deborah Garza is a 36 y.o. female.  The history is provided by the patient.  Back Pain   Episode onset: 2 weeks. Associated symptoms include abdominal pain (mendtrual cramping) and pelvic pain. Pertinent negatives include no bowel incontinence and no bladder incontinence.  Vaginal Bleeding  Primary symptoms include pelvic pain, vaginal bleeding.  Primary symptoms include no discharge. There has been no fever. Episode onset: 2 weeks. The problem occurs constantly. The problem has not changed since onset.Associated symptoms include abdominal pain (mendtrual cramping). Pertinent negatives include no diaphoresis, no diarrhea, no nausea and no vomiting. She has tried nothing for the symptoms. Associated medical issues do not include STD or PID.       Past Medical History:  Diagnosis Date  . Hypertension     Patient Active Problem List   Diagnosis Date Noted  . Obesity 12/04/2011  . Contraception management-Mirena IUD 12/04/2011    Past Surgical History:  Procedure Laterality Date  . CESAREAN SECTION    . ear tubes    . TONSILLECTOMY      OB History    Gravida Para Term Preterm AB Living   2 2           SAB TAB Ectopic Multiple Live Births                   Home Medications    Prior to Admission medications   Medication Sig Start Date End Date Taking? Authorizing Provider  acetaminophen (TYLENOL) 500 MG tablet Take 500 mg by mouth daily as needed. For pain    [provider]  amoxicillin-clavulanate (AUGMENTIN) 875-125 MG tablet Take 1 tablet by mouth every 12 (twelve) hours. 12/07/15   Fransico Meadow, PA-C  azithromycin (ZITHROMAX) 250 MG tablet Take 1 tablet (250 mg total) by mouth daily. Take first 2 tablets together, then 1 every day until finished.  07/11/16   Ashley Murrain, NP  benzonatate (TESSALON) 100 MG capsule Take 1 capsule (100 mg total) by mouth every 8 (eight) hours. 07/11/16   Ashley Murrain, NP  etonogestrel (NEXPLANON) 68 MG IMPL implant 1 each by Subdermal route once. 04/26/2015    [provider]  hydrochlorothiazide (HYDRODIURIL) 25 MG tablet Take 25 mg by mouth at bedtime.     [provider]  NIFEdipine (PROCARDIA XL/ADALAT-CC) 60 MG 24 hr tablet Take 30 mg by mouth daily.     [provider]  PARoxetine (PAXIL) 10 MG tablet Take 10 mg by mouth daily. 08/30/15   [provider]  Vitamin D, Ergocalciferol, (DRISDOL) 50000 units CAPS capsule Take 50,000 Units by mouth every 7 (seven) days. Wednesday    [provider]    Family History No family history on file.  Social History Social History  Substance Use Topics  . Smoking status: Never Smoker  . Smokeless tobacco: Not on file  . Alcohol use No     Allergies   Aspirin   Review of Systems Review of Systems  Constitutional: Negative for diaphoresis.  Gastrointestinal: Positive for abdominal pain (mendtrual cramping). Negative for bowel incontinence, diarrhea, nausea and vomiting.  Genitourinary: Positive for pelvic pain and vaginal bleeding. Negative for bladder incontinence.  Musculoskeletal: Positive for back pain.  All other systems  are reviewed and are negative for acute change except as noted in the HPI    Physical Exam Updated Vital Signs BP 137/70 (BP Location: Right Arm)   Pulse 66   Temp 98.6 F (37 C) (Oral)   Resp 20   Ht 5\' 6"  (1.676 m)   Wt 120.2 kg (265 lb)   SpO2 100%   BMI 42.77 kg/m   Physical Exam  Constitutional: She is oriented to person, place, and time. She appears well-developed and well-nourished. No distress.  HENT:  Head: Normocephalic and atraumatic.  Nose: Nose normal.  Eyes: Conjunctivae and EOM are normal. Pupils are equal, round, and reactive to light. Right eye exhibits  no discharge. Left eye exhibits no discharge. No scleral icterus.  Neck: Normal range of motion. Neck supple.  Cardiovascular: Normal rate and regular rhythm.  Exam reveals no gallop and no friction rub.   No murmur heard. Pulmonary/Chest: Effort normal and breath sounds normal. No stridor. No respiratory distress. She has no rales.  Abdominal: Soft. She exhibits no distension. There is no tenderness.  Musculoskeletal: She exhibits no edema or tenderness.  Neurological: She is alert and oriented to person, place, and time.  Skin: Skin is warm and dry. No rash noted. She is not diaphoretic. No erythema.  Psychiatric: She has a normal mood and affect.  Vitals reviewed.    ED Treatments / Results  Labs (all labs ordered are listed, but only abnormal results are displayed) Labs Reviewed  URINALYSIS, ROUTINE W REFLEX MICROSCOPIC - Abnormal; Notable for the following:       Result Value   APPearance HAZY (*)    Hgb urine dipstick LARGE (*)    Bacteria, UA RARE (*)    Squamous Epithelial / LPF 0-5 (*)    All other components within normal limits  PREGNANCY, URINE    EKG  EKG Interpretation None       Radiology No results found.  Procedures Procedures (including critical care time)  Medications Ordered in ED Medications - No data to display   Initial Impression / Assessment and Plan / ED Course  I have reviewed the triage vital signs and the nursing notes.  Pertinent labs & imaging results that were available during my care of the patient were reviewed by me and considered in my medical decision making (see chart for details).     Abnormal/prolonged uterine bleeding. UPT negative. Abdomen benign. Patient without symptoms of severe anemia. Adamantly denied any vaginal discharge. Given the benign abdomen and low suspicion for PID at this time.  UA without evidence of infection.  Recommended patient follow-up with her OB/GYN.  The patient is safe for discharge with  strict return precautions.   Final Clinical Impressions(s) / ED Diagnoses   Final diagnoses:  Abnormal vaginal bleeding  Abdominal cramping   Disposition: Discharge  Condition: Good  I have discussed the results, Dx and Tx plan with the patient who expressed understanding and agree(s) with the plan. Discharge instructions discussed at great length. The patient was given strict return precautions who verbalized understanding of the instructions. No further questions at time of discharge.    Discharge Medication List as of 01/30/2017  9:13 PM      Follow Up: Iona Beard, [address] [place] [address] [phone]  Schedule an appointment as soon as possible for a visit  As needed      Fatima Blank, MD 01/30/17 2219

## 2017-01-30 NOTE — ED Triage Notes (Signed)
Patient presents with heavy vaginal bleeding for the past two weeks. Patient has nexplanon in for 2 years states has had similar issue years ago and required oral b.c. To regulate hormones. Pt sts tried to make GYN appointment but earliest to be seen was next week. Patient states has recently had urinary hesitancy and lower back pain as well. Patient denies dysuria states possible hematuria difficult to ascertain due to vaginal bleeding. neagive CVA tenderness.

## 2017-03-11 ENCOUNTER — Other Ambulatory Visit: Payer: Self-pay | Admitting: Obstetrics and Gynecology

## 2018-05-14 ENCOUNTER — Other Ambulatory Visit: Payer: Self-pay | Admitting: Family Medicine

## 2018-05-14 ENCOUNTER — Ambulatory Visit
Admission: RE | Admit: 2018-05-14 | Discharge: 2018-05-14 | Disposition: A | Discharge status: Home / Self Care | Payer: BC Managed Care – PPO | Source: Ambulatory Visit | Attending: Family Medicine | Admitting: Family Medicine

## 2018-05-14 DIAGNOSIS — J219 Acute bronchiolitis, unspecified: Secondary | ICD-10-CM

## 2018-08-25 ENCOUNTER — Other Ambulatory Visit: Payer: Self-pay | Admitting: Obstetrics and Gynecology

## 2018-08-26 ENCOUNTER — Other Ambulatory Visit: Payer: Self-pay | Admitting: Obstetrics and Gynecology

## 2018-08-27 ENCOUNTER — Other Ambulatory Visit: Payer: Self-pay

## 2018-08-27 ENCOUNTER — Encounter (HOSPITAL_COMMUNITY): Payer: Self-pay | Admitting: *Deleted

## 2018-08-27 ENCOUNTER — Encounter (HOSPITAL_COMMUNITY)
Admission: RE | Admit: 2018-08-27 | Discharge: 2018-08-27 | Disposition: A | Discharge status: Home / Self Care | Payer: BC Managed Care – PPO | Source: Ambulatory Visit | Attending: Obstetrics and Gynecology | Admitting: Obstetrics and Gynecology

## 2018-08-27 DIAGNOSIS — I10 Essential (primary) hypertension: Secondary | ICD-10-CM | POA: Diagnosis not present

## 2018-08-27 DIAGNOSIS — Z01818 Encounter for other preprocedural examination: Secondary | ICD-10-CM | POA: Diagnosis not present

## 2018-08-27 HISTORY — DX: Anxiety disorder, unspecified: F41.9

## 2018-08-27 HISTORY — DX: Anemia, unspecified: D64.9

## 2018-08-27 HISTORY — DX: Personal history of urinary calculi: Z87.442

## 2018-08-27 LAB — BASIC METABOLIC PANEL
Anion gap: 8 (ref 5–15)
BUN: 13 mg/dL (ref 6–20)
CO2: 24 mmol/L (ref 22–32)
Calcium: 8.7 mg/dL — ABNORMAL LOW (ref 8.9–10.3)
Chloride: 105 mmol/L (ref 98–111)
Creatinine, Ser: 1.02 mg/dL — ABNORMAL HIGH (ref 0.44–1.00)
GFR calc non Af Amer: >60 mL/min (ref >60)
Glucose, Bld: 88 mg/dL (ref 70–99)
Potassium: 3.5 mmol/L (ref 3.5–5.1)
SODIUM: 137 mmol/L (ref 135–145)

## 2018-08-27 LAB — CBC
HCT: 41.4 % (ref 36.0–46.0)
Hemoglobin: 12.7 g/dL (ref 12.0–15.0)
MCH: 24.1 pg — ABNORMAL LOW (ref 26.0–34.0)
MCHC: 30.7 g/dL (ref 30.0–36.0)
MCV: 78.6 fL — ABNORMAL LOW (ref 80.0–100.0)
NRBC: 0 % (ref 0.0–0.2)
Platelets: 241 10*3/uL (ref 150–400)
RBC: 5.27 MIL/uL — ABNORMAL HIGH (ref 3.87–5.11)
RDW: 15.9 % — ABNORMAL HIGH (ref 11.5–15.5)
WBC: 7 10*3/uL (ref 4.0–10.5)

## 2018-08-27 NOTE — Pre-Procedure Instructions (Signed)
Deborah Garza  08/27/2018    Your procedure is scheduled on Friday, August 28, 2018 at 10:30 AM.   Report to Main Entrance of [facility] at 9:00 AM.              Pick up the phone at the desk and dial [phone].   Call this number if you have problems the morning of surgery: [phone]    Remember:  Do not eat or drink after midnight tonight.  Take these medicines the morning of surgery with A SIP OF WATER: Cetirizine (Zyrtec) - if needed, Tylenol - if needed    Do not wear jewelry, make-up or nail polish.  Do not wear lotions, powders, perfumes or deodorant.  Do not shave 48 hours prior to surgery.    Do not bring valuables to the hospital.  [facility] is not responsible for any belongings or valuables.  Contacts, dentures or bridgework may not be worn into surgery.  Leave your suitcase in the car.  After surgery it may be brought to your room.  For patients admitted to the hospital, discharge time will be determined by your treatment team.  Patients discharged the day of surgery will not be allowed to drive home.   [place] - Preparing for Surgery  Before surgery, you can play an important role.  Because skin is not sterile, your skin needs to be as free of germs as possible.  You can reduce the number of germs on you skin by washing with CHG (chlorahexidine gluconate) soap before surgery.  CHG is an antiseptic cleaner which kills germs and bonds with the skin to continue killing germs even after washing.  Oral Hygiene is also important in reducing the risk of infection.  Remember to brush your teeth with your regular toothpaste the morning of surgery.  Please DO NOT use if you have an allergy to CHG or antibacterial soaps.  If your skin becomes reddened/irritated stop using the CHG and inform your nurse when you arrive at Short Stay.  Do not shave (including legs and underarms) for at least 48 hours prior to the first CHG shower.  You may shave your face.  Please  follow these instructions carefully:   1.  Shower with CHG Soap the night before surgery and the morning of Surgery.  2.  If you choose to wash your hair, wash your hair first as usual with your normal shampoo.  3.  After you shampoo, rinse your hair and body thoroughly to remove the shampoo. 4.  Use CHG as you would any other liquid soap.  You can apply chg directly to the skin and wash gently with a      scrungie or washcloth.           5.  Apply the CHG Soap to your body ONLY FROM THE NECK DOWN.   Do not use on open wounds or open sores. Avoid contact with your eyes, ears, mouth and genitals (private parts).  Wash genitals (private parts) with your normal soap.  6.  Wash thoroughly, paying special attention to the area where your surgery will be performed.  7.  Thoroughly rinse your body with warm water from the neck down.  8.  DO NOT shower/wash with your normal soap after using and rinsing off the CHG Soap.  9.  Pat yourself dry with a clean towel.            10.  Wear clean pajamas.  11.  Place clean sheets on your bed the night of your first shower and do not sleep with pets.  Day of Surgery  Shower as above.  Do not apply any lotions/deodorants the morning of surgery.   Please wear clean clothes to the hospital. Remember to brush your teeth with toothpaste.   Please read over the fact sheets that you were given.

## 2018-08-27 NOTE — Progress Notes (Signed)
PCP - Dr. Iona Beard Cardiologist - n/a  Chest x-ray - n/a EKG - today Stress Test - n/a ECHO - n/a Cardiac Cath - n/a  Sleep Study - 11/13/16 in Epic CPAP - none  Fasting Blood Sugar - n/a Checks Blood Sugar _____ times a day  Blood Thinner Instructions: n/a Aspirin Instructions: n/a  Anesthesia review: no  Patient denies shortness of breath, fever, cough and chest pain at PAT appointment   Patient verbalized understanding of instructions that were given to them at the PAT appointment. Patient was also instructed that they will need to review over the PAT instructions again at home before surgery.

## 2018-08-28 ENCOUNTER — Ambulatory Visit (HOSPITAL_COMMUNITY)
Admission: RE | Admit: 2018-08-28 | Discharge: 2018-08-28 | Disposition: A | Discharge status: Home / Self Care | Payer: BC Managed Care – PPO | Attending: Obstetrics and Gynecology | Admitting: Obstetrics and Gynecology

## 2018-08-28 ENCOUNTER — Encounter (HOSPITAL_COMMUNITY)
Admission: RE | Disposition: A | Discharge status: Home / Self Care | Payer: Self-pay | Source: Home / Self Care | Attending: Obstetrics and Gynecology

## 2018-08-28 ENCOUNTER — Ambulatory Visit (HOSPITAL_COMMUNITY): Payer: BC Managed Care – PPO | Admitting: Certified Registered Nurse Anesthetist

## 2018-08-28 ENCOUNTER — Encounter (HOSPITAL_COMMUNITY): Payer: Self-pay

## 2018-08-28 DIAGNOSIS — Z302 Encounter for sterilization: Secondary | ICD-10-CM | POA: Diagnosis not present

## 2018-08-28 DIAGNOSIS — D219 Benign neoplasm of connective and other soft tissue, unspecified: Secondary | ICD-10-CM

## 2018-08-28 DIAGNOSIS — N920 Excessive and frequent menstruation with regular cycle: Secondary | ICD-10-CM | POA: Diagnosis not present

## 2018-08-28 DIAGNOSIS — I10 Essential (primary) hypertension: Secondary | ICD-10-CM | POA: Insufficient documentation

## 2018-08-28 DIAGNOSIS — Z79899 Other long term (current) drug therapy: Secondary | ICD-10-CM | POA: Insufficient documentation

## 2018-08-28 DIAGNOSIS — D259 Leiomyoma of uterus, unspecified: Secondary | ICD-10-CM | POA: Diagnosis not present

## 2018-08-28 DIAGNOSIS — Z793 Long term (current) use of hormonal contraceptives: Secondary | ICD-10-CM | POA: Diagnosis not present

## 2018-08-28 DIAGNOSIS — Z6841 Body Mass Index (BMI) 40.0 and over, adult: Secondary | ICD-10-CM | POA: Diagnosis not present

## 2018-08-28 HISTORY — PX: DILITATION & CURRETTAGE/HYSTROSCOPY WITH HYDROTHERMAL ABLATION: SHX5570

## 2018-08-28 HISTORY — PX: LAPAROSCOPIC TUBAL LIGATION: SHX1937

## 2018-08-28 LAB — PREGNANCY, URINE: Preg Test, Ur: NEGATIVE

## 2018-08-28 SURGERY — DILATATION & CURETTAGE/HYSTEROSCOPY WITH HYDROTHERMAL ABLATION
Anesthesia: General | Site: Vagina

## 2018-08-28 MED ORDER — FENTANYL CITRATE (PF) 250 MCG/5ML IJ SOLN
INTRAMUSCULAR | Status: DC | PRN
  Administered 2018-08-28: 50 ug via INTRAVENOUS
  Administered 2018-08-28: 100 ug via INTRAVENOUS
  Administered 2018-08-28: 50 ug via INTRAVENOUS
  Administered 2018-08-28: 100 ug via INTRAVENOUS
  Administered 2018-08-28: 50 ug via INTRAVENOUS

## 2018-08-28 MED ORDER — OXYCODONE HCL 5 MG PO TABS: 5.0000 mg | ORAL_TABLET | Freq: Once | ORAL | Status: DC | PRN

## 2018-08-28 MED ORDER — SUGAMMADEX SODIUM 500 MG/5ML IV SOLN
INTRAVENOUS | Status: DC | PRN
  Administered 2018-08-28: 270 mg via INTRAVENOUS

## 2018-08-28 MED ORDER — SCOPOLAMINE 1 MG/3DAYS TD PT72
1.0000 | MEDICATED_PATCH | Freq: Once | TRANSDERMAL | Status: DC
  Administered 2018-08-28: 1.5 mg via TRANSDERMAL

## 2018-08-28 MED ORDER — BUPIVACAINE HCL (PF) 0.25 % IJ SOLN
INTRAMUSCULAR | Status: AC
  Filled 2018-08-28: qty 30

## 2018-08-28 MED ORDER — SCOPOLAMINE 1 MG/3DAYS TD PT72
MEDICATED_PATCH | TRANSDERMAL | Status: AC
  Administered 2018-08-28: 1.5 mg via TRANSDERMAL
  Filled 2018-08-28: qty 1

## 2018-08-28 MED ORDER — DEXAMETHASONE SODIUM PHOSPHATE 10 MG/ML IJ SOLN
INTRAMUSCULAR | Status: DC | PRN
  Administered 2018-08-28: 10 mg via INTRAVENOUS

## 2018-08-28 MED ORDER — MEPERIDINE HCL 25 MG/ML IJ SOLN: 6.2500 mg | INTRAMUSCULAR | Status: DC | PRN

## 2018-08-28 MED ORDER — FENTANYL CITRATE (PF) 100 MCG/2ML IJ SOLN: 25.0000 ug | INTRAMUSCULAR | Status: DC | PRN

## 2018-08-28 MED ORDER — SODIUM CHLORIDE 0.9 % IR SOLN
Status: DC | PRN
  Administered 2018-08-28: 3000 mL

## 2018-08-28 MED ORDER — MIDAZOLAM HCL 2 MG/2ML IJ SOLN
INTRAMUSCULAR | Status: DC | PRN
  Administered 2018-08-28: 2 mg via INTRAVENOUS

## 2018-08-28 MED ORDER — ACETAMINOPHEN 160 MG/5ML PO SOLN: 325.0000 mg | ORAL | Status: DC | PRN

## 2018-08-28 MED ORDER — LIDOCAINE HCL 1 % IJ SOLN
INTRAMUSCULAR | Status: DC | PRN
  Administered 2018-08-28: 10 mL

## 2018-08-28 MED ORDER — BUPIVACAINE HCL (PF) 0.25 % IJ SOLN
INTRAMUSCULAR | Status: DC | PRN
  Administered 2018-08-28: 10 mL

## 2018-08-28 MED ORDER — ONDANSETRON HCL 4 MG/2ML IJ SOLN
INTRAMUSCULAR | Status: AC
  Filled 2018-08-28: qty 2

## 2018-08-28 MED ORDER — LACTATED RINGERS IV SOLN
INTRAVENOUS | Status: DC
  Administered 2018-08-28: 125 mL/h via INTRAVENOUS
  Administered 2018-08-28: 11:00:00 via INTRAVENOUS

## 2018-08-28 MED ORDER — LIDOCAINE HCL (CARDIAC) PF 100 MG/5ML IV SOSY
PREFILLED_SYRINGE | INTRAVENOUS | Status: DC | PRN
  Administered 2018-08-28: 100 mg via INTRAVENOUS

## 2018-08-28 MED ORDER — MIDAZOLAM HCL 2 MG/2ML IJ SOLN
INTRAMUSCULAR | Status: AC
  Filled 2018-08-28: qty 2

## 2018-08-28 MED ORDER — KETOROLAC TROMETHAMINE 30 MG/ML IJ SOLN
INTRAMUSCULAR | Status: DC | PRN
  Administered 2018-08-28: 30 mg via INTRAVENOUS

## 2018-08-28 MED ORDER — LIDOCAINE HCL 1 % IJ SOLN
INTRAMUSCULAR | Status: AC
  Filled 2018-08-28: qty 20

## 2018-08-28 MED ORDER — ROCURONIUM BROMIDE 100 MG/10ML IV SOLN
INTRAVENOUS | Status: AC
  Filled 2018-08-28: qty 1

## 2018-08-28 MED ORDER — ACETAMINOPHEN 325 MG PO TABS
325.0000 mg | ORAL_TABLET | ORAL | Status: DC | PRN
  Administered 2018-08-28: 650 mg via ORAL

## 2018-08-28 MED ORDER — ROCURONIUM BROMIDE 100 MG/10ML IV SOLN
INTRAVENOUS | Status: DC | PRN
  Administered 2018-08-28: 10 mg via INTRAVENOUS
  Administered 2018-08-28: 60 mg via INTRAVENOUS
  Administered 2018-08-28: 10 mg via INTRAVENOUS

## 2018-08-28 MED ORDER — FENTANYL CITRATE (PF) 250 MCG/5ML IJ SOLN
INTRAMUSCULAR | Status: AC
  Filled 2018-08-28: qty 5

## 2018-08-28 MED ORDER — DEXAMETHASONE SODIUM PHOSPHATE 10 MG/ML IJ SOLN
INTRAMUSCULAR | Status: AC
  Filled 2018-08-28: qty 1

## 2018-08-28 MED ORDER — ACETAMINOPHEN 325 MG PO TABS
ORAL_TABLET | ORAL | Status: AC
  Filled 2018-08-28: qty 2

## 2018-08-28 MED ORDER — ONDANSETRON HCL 4 MG/2ML IJ SOLN
INTRAMUSCULAR | Status: DC | PRN
  Administered 2018-08-28: 4 mg via INTRAVENOUS

## 2018-08-28 MED ORDER — LIDOCAINE HCL (PF) 1 % IJ SOLN
INTRAMUSCULAR | Status: AC
  Filled 2018-08-28: qty 5

## 2018-08-28 MED ORDER — ONDANSETRON HCL 4 MG/2ML IJ SOLN: 4.0000 mg | Freq: Once | INTRAMUSCULAR | Status: DC | PRN

## 2018-08-28 MED ORDER — DEXTROSE 5 % IV SOLN
INTRAVENOUS | Status: DC | PRN
  Administered 2018-08-28: 3 g via INTRAVENOUS

## 2018-08-28 MED ORDER — PROPOFOL 10 MG/ML IV BOLUS
INTRAVENOUS | Status: DC | PRN
  Administered 2018-08-28: 200 mg via INTRAVENOUS
  Administered 2018-08-28 (×2): 50 mg via INTRAVENOUS

## 2018-08-28 MED ORDER — SUGAMMADEX SODIUM 500 MG/5ML IV SOLN
INTRAVENOUS | Status: AC
  Filled 2018-08-28: qty 5

## 2018-08-28 MED ORDER — PROPOFOL 10 MG/ML IV BOLUS
INTRAVENOUS | Status: AC
  Filled 2018-08-28: qty 40

## 2018-08-28 MED ORDER — OXYCODONE HCL 5 MG/5ML PO SOLN: 5.0000 mg | Freq: Once | ORAL | Status: DC | PRN

## 2018-08-28 MED ORDER — OXYCODONE-ACETAMINOPHEN 5-325 MG PO TABS: 1.0000 | ORAL_TABLET | Freq: Four times a day (QID) | ORAL | 0 refills | Status: DC | PRN

## 2018-08-28 MED ORDER — KETOROLAC TROMETHAMINE 30 MG/ML IJ SOLN
INTRAMUSCULAR | Status: AC
  Filled 2018-08-28: qty 1

## 2018-08-28 SURGICAL SUPPLY — 40 items
ABLATOR SURESOUND NOVASURE (ABLATOR) ×3 IMPLANT
ADH SKN CLS APL DERMABOND .7 (GAUZE/BANDAGES/DRESSINGS) ×3
CANISTER SUCT 3000ML PPV (MISCELLANEOUS) ×5 IMPLANT
CATH ROBINSON RED A/P 16FR (CATHETERS) ×5 IMPLANT
DERMABOND ADVANCED (GAUZE/BANDAGES/DRESSINGS) ×2
DERMABOND ADVANCED .7 DNX12 (GAUZE/BANDAGES/DRESSINGS) ×3 IMPLANT
DEVICE MYOSURE LITE (MISCELLANEOUS) IMPLANT
DEVICE MYOSURE REACH (MISCELLANEOUS) IMPLANT
DRSG OPSITE POSTOP 3X4 (GAUZE/BANDAGES/DRESSINGS) ×3 IMPLANT
DURAPREP 26ML APPLICATOR (WOUND CARE) ×5 IMPLANT
FILTER SMOKE EVAC LAPAROSHD (FILTER) ×3 IMPLANT
GAUZE VASELINE 3X9 (GAUZE/BANDAGES/DRESSINGS) ×6 IMPLANT
GLOVE BIO SURGEON STRL SZ7.5 (GLOVE) ×5 IMPLANT
GLOVE BIOGEL PI IND STRL 7.0 (GLOVE) ×6 IMPLANT
GLOVE BIOGEL PI IND STRL 7.5 (GLOVE) ×6 IMPLANT
GLOVE BIOGEL PI INDICATOR 7.0 (GLOVE) ×4
GLOVE BIOGEL PI INDICATOR 7.5 (GLOVE) ×4
GOWN STRL REUS W/TWL LRG LVL3 (GOWN DISPOSABLE) ×10 IMPLANT
HIBICLENS CHG 4% 4OZ BTL (MISCELLANEOUS) ×5 IMPLANT
KIT PROCEDURE FLUENT (KITS) ×5 IMPLANT
NEEDLE HYPO 22GX1.5 SAFETY (NEEDLE) ×5 IMPLANT
NEEDLE INSUFFLATION 120MM (ENDOMECHANICALS) ×5 IMPLANT
PACK LAPAROSCOPY BASIN (CUSTOM PROCEDURE TRAY) ×5 IMPLANT
PACK TRENDGUARD 450 HYBRID PRO (MISCELLANEOUS) ×1 IMPLANT
PACK VAGINAL MINOR WOMEN LF (CUSTOM PROCEDURE TRAY) ×5 IMPLANT
PAD OB MATERNITY 4.3X12.25 (PERSONAL CARE ITEMS) ×5 IMPLANT
PROTECTOR NERVE ULNAR (MISCELLANEOUS) ×10 IMPLANT
SEAL ROD LENS SCOPE MYOSURE (ABLATOR) ×5 IMPLANT
SET GENESYS HTA PROCERVA (MISCELLANEOUS) ×5 IMPLANT
SLEEVE XCEL OPT CAN 5 100 (ENDOMECHANICALS) ×5 IMPLANT
SUT MNCRL AB 3-0 PS2 27 (SUTURE) ×5 IMPLANT
SUT VICRYL 0 UR6 27IN ABS (SUTURE) ×8 IMPLANT
TOWEL OR 17X24 6PK STRL BLUE (TOWEL DISPOSABLE) ×10 IMPLANT
TRENDGUARD 450 HYBRID PRO PACK (MISCELLANEOUS) ×5
TROCAR 12M 150ML BLUNT (TROCAR) ×3 IMPLANT
TROCAR BALLN 12MMX100 BLUNT (TROCAR) ×3 IMPLANT
TROCAR XCEL NON-BLD 11X100MML (ENDOMECHANICALS) ×5 IMPLANT
TROCAR XCEL NON-BLD 5MMX100MML (ENDOMECHANICALS) ×5 IMPLANT
TUBING INSUF HEATED (TUBING) ×5 IMPLANT
WARMER LAPAROSCOPE (MISCELLANEOUS) ×5 IMPLANT

## 2018-08-28 NOTE — Anesthesia Preprocedure Evaluation (Signed)
Anesthesia Evaluation  Patient identified by MRN, date of birth, ID band Patient awake    Reviewed: Allergy & Precautions, NPO status   Airway Mallampati: II       Dental no notable dental hx. (+) Teeth Intact   Pulmonary    Pulmonary exam normal breath sounds clear to auscultation       Cardiovascular hypertension, Pt. on medications Normal cardiovascular exam Rhythm:Regular Rate:Normal     Neuro/Psych    GI/Hepatic negative GI ROS, Neg liver ROS,   Endo/Other  Morbid obesity  Renal/GU negative Renal ROS  negative genitourinary   Musculoskeletal negative musculoskeletal ROS (+)   Abdominal (+) + obese,   Peds  Hematology   Anesthesia Other Findings   Reproductive/Obstetrics                             Anesthesia Physical Anesthesia Plan  ASA: III  Anesthesia Plan: General   Post-op Pain Management:    Induction: Intravenous  PONV Risk Score and Plan: Ondansetron, Dexamethasone, Scopolamine patch - Pre-op and Midazolam  Airway Management Planned: Oral ETT  Additional Equipment:   Intra-op Plan:   Post-operative Plan: Extubation in OR  Informed Consent: I have reviewed the patients History and Physical, chart, labs and discussed the procedure including the risks, benefits and alternatives for the proposed anesthesia with the patient or authorized representative who has indicated his/her understanding and acceptance.     Dental advisory given  Plan Discussed with: CRNA  Anesthesia Plan Comments:         Anesthesia Quick Evaluation

## 2018-08-28 NOTE — H&P (Signed)
Deborah Garza is an 38 y.o. female. Pt with persistent abnormal bleeding and enlarged fibroid uterus scheduled for hyst./D&C/HTA/possible myosure resection/Lap tubal sterilization.  Pertinent Gynecological History: Menorrhagia  Menstrual History:  No LMP recorded. (Menstrual status: Oral contraceptives).    Past Medical History:  Diagnosis Date  . Anemia    low iron  . Anxiety   . History of kidney stones   . Hypertension     Past Surgical History:  Procedure Laterality Date  . CESAREAN SECTION    . ear tubes    . TONSILLECTOMY      History reviewed. No pertinent family history.  Social History:  reports that she has never smoked. She has never used smokeless tobacco. She reports that she does not drink alcohol or use drugs.  Allergies:  Allergies  Allergen Reactions  . Aspirin Other (See Comments)    Childhood allergy--reaction is unknown    Medications Prior to Admission  Medication Sig Dispense Refill Last Dose  . acetaminophen (TYLENOL) 500 MG tablet Take 1,000 mg by mouth every 6 (six) hours as needed (pain.). For pain    Past Week at Unknown time  . cetirizine (ZYRTEC) 10 MG tablet Take 10 mg by mouth daily as needed for allergies.   Past Month at Unknown time  . Cholecalciferol (VITAMIN D3) 1.25 MG (50000 UT) CAPS Take 50,000 Units by mouth every Wednesday.   Past Week at Unknown time  . hydrochlorothiazide (HYDRODIURIL) 25 MG tablet Take 25 mg by mouth at bedtime.    08/27/2018 at Unknown time  . ibuprofen (ADVIL,MOTRIN) 800 MG tablet Take 800 mg by mouth every 8 (eight) hours as needed (for pain.).   Past Week at Unknown time  . NIFEdipine (ADALAT CC) 30 MG 24 hr tablet Take 30 mg by mouth every evening.   08/27/2018 at Unknown time  . norethindrone (AYGESTIN) 5 MG tablet Take 5 mg by mouth every evening.   08/27/2018 at Unknown time  . PARoxetine (PAXIL) 10 MG tablet Take 10 mg by mouth every evening.   1 08/27/2018 at Unknown time    ROS Denies  F/C/N/V/D  Blood pressure (!) 130/95, pulse 69, temperature 98.1 F (36.7 C), temperature source Oral, resp. rate 16, SpO2 100 %. Physical Exam Lungs CTA CV RRR Abd soft, NT Ext no calf tenderness  Results for orders placed or performed during the hospital encounter of 08/28/18 (from the past 24 hour(s))  Pregnancy, urine     Status: None   Collection Time: 08/28/18  9:00 AM  Result Value Ref Range   Preg Test, Ur NEGATIVE NEGATIVE    No results found.  Assessment/Plan: Pt with menorrhagia scheduled for hyst./D&C/HTA/possible myosure resection/Lap tubal sterilization.  R/B/A reviewed.  Pt understands I may not be able to do sterilization d/t size of uterus and will want an IUD instead.  Questions answered and consent s/w.  Delice Lesch 08/28/2018, 10:22 AM

## 2018-08-28 NOTE — Discharge Instructions (Signed)
DISCHARGE INSTRUCTIONS: D&C / D&E The following instructions have been prepared to help you care for yourself upon your return home.   Personal hygiene:  Use sanitary pads for vaginal drainage, not tampons.  Shower the day after your procedure.  NO tub baths, pools or Jacuzzis for 2-3 weeks.  Wipe front to back after using the bathroom.  Activity and limitations:  Do NOT drive or operate any equipment for 24 hours. The effects of anesthesia are still present and drowsiness may result.  Do NOT rest in bed all day.  Walking is encouraged.  Walk up and down stairs slowly.  You may resume your normal activity in one to two days or as indicated by your physician.  Sexual activity: NO intercourse for at least 2 weeks after the procedure, or as indicated by your physician.  Diet: Eat a light meal as desired this evening. You may resume your usual diet tomorrow.  Return to work: You may resume your work activities in one to two days or as indicated by your doctor.  What to expect after your surgery: Expect to have vaginal bleeding/discharge for 2-3 days and spotting for up to 10 days. It is not unusual to have soreness for up to 1-2 weeks. You may have a slight burning sensation when you urinate for the first day. Mild cramps may continue for a couple of days. You may have a regular period in 2-6 weeks.  Call your doctor for any of the following:  Excessive vaginal bleeding, saturating and changing one pad every hour.  Inability to urinate 6 hours after discharge from hospital.  Pain not relieved by pain medication.  Fever of 100.4 F or greater.  Unusual vaginal discharge or odor.   Call for an appointment:    Patients signature: ______________________  Nurses signature ________________________  Support person's signature_______________________  Call your surgeon if you experience:   1.  Fever over 101.0. 2.  Inability to urinate. 3.  Nausea and/or vomiting. 4.   Extreme swelling or bruising at the surgical site. 5.  Continued bleeding from the incision. 6.  Increased pain, redness or drainage from the incision. 7.  Problems related to your pain medication. 8.  Any problems and/or concerns     Post Anesthesia Home Care Instructions  Activity: Get plenty of rest for the remainder of the day. A responsible individual must stay with you for 24 hours following the procedure.  For the next 24 hours, DO NOT: -Drive a car -Paediatric nurse -Drink alcoholic beverages -Take any medication unless instructed by your physician -Make any legal decisions or sign important papers.  Meals: Start with liquid foods such as gelatin or soup. Progress to regular foods as tolerated. Avoid greasy, spicy, heavy foods. If nausea and/or vomiting occur, drink only clear liquids until the nausea and/or vomiting subsides. Call your physician if vomiting continues.  Special Instructions/Symptoms: Your throat may feel dry or sore from the anesthesia or the breathing tube placed in your throat during surgery. If this causes discomfort, gargle with warm salt water. The discomfort should disappear within 24 hours.  If you had a scopolamine patch placed behind your ear for the management of post- operative nausea and/or vomiting:  1. The medication in the patch is effective for 72 hours, after which it should be removed.  Wrap patch in a tissue and discard in the trash. Wash hands thoroughly with soap and water. 2. You may remove the patch earlier than 72 hours if you experience unpleasant  side effects which may include dry mouth, dizziness or visual disturbances. 3. Avoid touching the patch. Wash your hands with soap and water after contact with the patch.    NO IBUPROFEN PRODUCTS (MOTRIN, ADVIL) OR ALEVE UNTIL 7:15PM TODAY.

## 2018-08-28 NOTE — Anesthesia Procedure Notes (Addendum)
Procedure Name: Intubation Date/Time: 08/28/2018 10:40 AM Performed by: Raenette Rover, CRNA Pre-anesthesia Checklist: Patient identified, Emergency Drugs available, Suction available and Patient being monitored Patient Re-evaluated:Patient Re-evaluated prior to induction Oxygen Delivery Method: Circle system utilized Preoxygenation: Pre-oxygenation with 100% oxygen Induction Type: IV induction Ventilation: Mask ventilation without difficulty Laryngoscope Size: Glidescope and 4 Grade View: Grade I Tube type: Oral Tube size: 7.0 mm Number of attempts: 3 Airway Equipment and Method: Stylet and Video-laryngoscopy Placement Confirmation: ETT inserted through vocal cords under direct vision,  positive ETCO2,  CO2 detector and breath sounds checked- equal and bilateral Secured at: 24 cm Tube secured with: Tape Dental Injury: Teeth and Oropharynx as per pre-operative assessment  Difficulty Due To: Difficult Airway- due to large tongue Future Recommendations: Recommend- induction with short-acting agent, and alternative techniques readily available Comments: DLx2 with MAC 3 by CRNA, no success with ETT pass. DL x1 with Glidescope 4 by CRNA--Grade 1 view and easy successful tracheal intubation. Easy mask ventilation with no OAW required. Lots of redundant tissue in pharynx.

## 2018-08-28 NOTE — Interval H&P Note (Signed)
History and Physical Interval Note:  08/28/2018 10:27 AM  Deborah Garza  has presented today for surgery, with the diagnosis of Menorrhagia, Desire for sterilization  The various methods of treatment have been discussed with the patient and family. After consideration of risks, benefits and other options for treatment, the patient has consented to  Procedure(s): DILATATION & CURETTAGE/HYSTEROSCOPY WITH HYDROTHERMAL ABLATION (N/A) POSSIBLE DILATATION & CURETTAGE/HYSTEROSCOPY WITH MYOSURE (Bilateral) LAPAROSCOPIC TUBAL LIGATION/FULGURATION (Bilateral) as a surgical intervention .  The patient's history has been reviewed, patient examined, no change in status, stable for surgery.  I have reviewed the patient's chart and labs.  Questions were answered to the patient's satisfaction.     Delice Lesch

## 2018-08-28 NOTE — Anesthesia Postprocedure Evaluation (Signed)
Anesthesia Post Note  Patient: Deborah Garza  Procedure(s) Performed: DILATATION & CURETTAGE/HYSTEROSCOPY, Attempted Hydrothermal Ablation, Attempted Novasure (N/A Vagina ) Attempted LAPAROSCOPIC TUBAL LIGATION (Bilateral Abdomen)     Patient location during evaluation: PACU Anesthesia Type: General Level of consciousness: awake Pain management: pain level controlled Vital Signs Assessment: post-procedure vital signs reviewed and stable Respiratory status: spontaneous breathing Cardiovascular status: stable Postop Assessment: no apparent nausea or vomiting Anesthetic complications: no    Last Vitals:  Vitals:   08/28/18 1425 08/28/18 1430  BP:  123/77  Pulse: 78 86  Resp: 18 20  Temp:    SpO2: 98% 97%    Last Pain:  Vitals:   08/28/18 1430  TempSrc:   PainSc: 0-No pain   Pain Goal: Patients Stated Pain Goal: 5 (08/28/18 0923)                 Huston Foley

## 2018-08-28 NOTE — H&P (View-Only) (Signed)
Deborah Garza is an 38 y.o. female. Pt with persistent abnormal bleeding and enlarged fibroid uterus scheduled for hyst./D&C/HTA/possible myosure resection/Lap tubal sterilization.  Pertinent Gynecological History: Menorrhagia  Menstrual History:  No LMP recorded. (Menstrual status: Oral contraceptives).    Past Medical History:  Diagnosis Date  . Anemia    low iron  . Anxiety   . History of kidney stones   . Hypertension     Past Surgical History:  Procedure Laterality Date  . CESAREAN SECTION    . ear tubes    . TONSILLECTOMY      History reviewed. No pertinent family history.  Social History:  reports that she has never smoked. She has never used smokeless tobacco. She reports that she does not drink alcohol or use drugs.  Allergies:  Allergies  Allergen Reactions  . Aspirin Other (See Comments)    Childhood allergy--reaction is unknown    Medications Prior to Admission  Medication Sig Dispense Refill Last Dose  . acetaminophen (TYLENOL) 500 MG tablet Take 1,000 mg by mouth every 6 (six) hours as needed (pain.). For pain    Past Week at Unknown time  . cetirizine (ZYRTEC) 10 MG tablet Take 10 mg by mouth daily as needed for allergies.   Past Month at Unknown time  . Cholecalciferol (VITAMIN D3) 1.25 MG (50000 UT) CAPS Take 50,000 Units by mouth every Wednesday.   Past Week at Unknown time  . hydrochlorothiazide (HYDRODIURIL) 25 MG tablet Take 25 mg by mouth at bedtime.    08/27/2018 at Unknown time  . ibuprofen (ADVIL,MOTRIN) 800 MG tablet Take 800 mg by mouth every 8 (eight) hours as needed (for pain.).   Past Week at Unknown time  . NIFEdipine (ADALAT CC) 30 MG 24 hr tablet Take 30 mg by mouth every evening.   08/27/2018 at Unknown time  . norethindrone (AYGESTIN) 5 MG tablet Take 5 mg by mouth every evening.   08/27/2018 at Unknown time  . PARoxetine (PAXIL) 10 MG tablet Take 10 mg by mouth every evening.   1 08/27/2018 at Unknown time    ROS Denies  F/C/N/V/D  Blood pressure (!) 130/95, pulse 69, temperature 98.1 F (36.7 C), temperature source Oral, resp. rate 16, SpO2 100 %. Physical Exam Lungs CTA CV RRR Abd soft, NT Ext no calf tenderness  Results for orders placed or performed during the hospital encounter of 08/28/18 (from the past 24 hour(s))  Pregnancy, urine     Status: None   Collection Time: 08/28/18  9:00 AM  Result Value Ref Range   Preg Test, Ur NEGATIVE NEGATIVE    No results found.  Assessment/Plan: Pt with menorrhagia scheduled for hyst./D&C/HTA/possible myosure resection/Lap tubal sterilization.  R/B/A reviewed.  Pt understands I may not be able to do sterilization d/t size of uterus and will want an IUD instead.  Questions answered and consent s/w.  Delice Lesch 08/28/2018, 10:22 AM

## 2018-08-28 NOTE — Transfer of Care (Signed)
Immediate Anesthesia Transfer of Care Note  Patient: Deborah Garza  Procedure(s) Performed: DILATATION & CURETTAGE/HYSTEROSCOPY, Attempted Hydrothermal Ablation, Attempted Novasure (N/A Vagina ) Attempted LAPAROSCOPIC TUBAL LIGATION (Bilateral Abdomen)  Patient Location: PACU  Anesthesia Type:General  Level of Consciousness: awake, alert , oriented and patient cooperative  Airway & Oxygen Therapy: Patient Spontanous Breathing and Patient connected to nasal cannula oxygen  Post-op Assessment: Report given to RN and Post -op Vital signs reviewed and stable  Post vital signs: Reviewed and stable  Last Vitals:  Vitals Value Taken Time  BP    Temp    Pulse 94 08/28/2018  1:35 PM  Resp 13 08/28/2018  1:35 PM  SpO2 100 % 08/28/2018  1:35 PM  Vitals shown include unvalidated device data.  Last Pain:  Vitals:   08/28/18 0923  TempSrc: Oral  PainSc: 0-No pain      Patients Stated Pain Goal: 5 (27/74/12 8786)  Complications: No apparent anesthesia complications

## 2018-08-29 ENCOUNTER — Encounter (HOSPITAL_COMMUNITY): Payer: Self-pay | Admitting: Obstetrics and Gynecology

## 2018-09-03 ENCOUNTER — Other Ambulatory Visit: Payer: Self-pay | Admitting: Obstetrics and Gynecology

## 2018-09-03 NOTE — Patient Instructions (Addendum)
Deborah Garza  09/03/2018         Your procedure is scheduled on 09-08-18    Report to [facility]  at  9:00 A.M.   Call this number if you have problems the morning of [phone]   OUR ADDRESS IS [place], WE ARE [facility].   Remember: Do not eat food or drink liquids after midnight.    Take these medicines the morning of surgery with A SIP OF WATER: None, per pt's preference   Do not wear jewelry, make-up or nail polish.  Do not wear lotions, powders, or perfumes, or deoderant.  Do not shave 48 hours prior to surgery.   Do not bring valuables to the hospital.  [facility] is not responsible for any belongings or valuables.  Contacts, dentures or bridgework may not be worn into surgery.  Leave your suitcase in the car.  After surgery it may be brought to your room.  For patients admitted to the hospital, discharge time will be determined by your treatment team.  Patients discharged the day of surgery will not be allowed to drive home.   Special instructions:  Please bring your medication in their original pill box  Please read over the following fact sheets that you were given:       [facility] - Preparing for Surgery Before surgery, you can play an important role.  Because skin is not sterile, your skin needs to be as free of germs as possible.  You can reduce the number of germs on your skin by washing with CHG (chlorahexidine gluconate) soap before surgery.  CHG is an antiseptic cleaner which kills germs and bonds with the skin to continue killing germs even after washing. Please DO NOT use if you have an allergy to CHG or antibacterial soaps.  If your skin becomes reddened/irritated stop using the CHG and inform your nurse when you arrive at Short Stay. Do not shave (including legs and underarms) for at least 48 hours prior to the first CHG shower.  You may shave your face/neck. Please  follow these instructions carefully:  1.  Shower with CHG Soap the night before surgery and the  morning of Surgery.  2.  If you choose to wash your hair, wash your hair first as usual with your  normal  shampoo.  3.  After you shampoo, rinse your hair and body thoroughly to remove the  shampoo.                           4.  Use CHG as you would any other liquid soap.  You can apply chg directly  to the skin and wash                       Gently with a scrungie or clean washcloth.  5.  Apply the CHG Soap to your body ONLY FROM THE NECK DOWN.   Do not use on face/ open                           Wound or open sores. Avoid contact with eyes, ears mouth and genitals (private parts).                       Wash face,  Genitals (private parts) with your  normal soap.             6.  Wash thoroughly, paying special attention to the area where your surgery  will be performed.  7.  Thoroughly rinse your body with warm water from the neck down.  8.  DO NOT shower/wash with your normal soap after using and rinsing off  the CHG Soap.                9.  Pat yourself dry with a clean towel.            10.  Wear clean pajamas.            11.  Place clean sheets on your bed the night of your first shower and do not  sleep with pets. Day of Surgery : Do not apply any lotions/deodorants the morning of surgery.  Please wear clean clothes to the hospital/surgery center.  FAILURE TO FOLLOW THESE INSTRUCTIONS MAY RESULT IN THE CANCELLATION OF YOUR SURGERY PATIENT SIGNATURE_________________________________  NURSE SIGNATURE__________________________________  ________________________________________________________________________

## 2018-09-03 NOTE — Progress Notes (Signed)
08-27-18 ( Epic) EKG, CBC, CMP  05-14-18 (Epic) CXR

## 2018-09-03 NOTE — H&P (Addendum)
Deborah Garza is a 38 y.o. female, P:  2-0-0-2 who presents for hysterectomy because of symptomatic uterine fibroids and abnormal uterine bleeding. For eleven years the patient has known of her fibroids and endured menstrual periods that lasted 6 days with pad changes 6 or more times a day.  She admits to having cramping that she rated 7/10 on a 10 point pain scale but was able to find relief with Tylenol.  Over the years she has had some relief with oral contraceptives  (only 4 pads a day) but with her history of hypertension the Mirena IUD was used instead.  She had two episodes of partial expulsions with the Mirena IUD and subsequently received the Nexplanon implant in July 2017.  Initially the patient only experienced two days of spotting with the Nexplanon but in recent months she developed random spotting that alternated with heavy, menstrual-like bleeding and spotting..  She had not previously had any intermenstrual bleeding.  She went on to deniy any  vaginitis symptoms, dyspareunia or changes in her bowel or bladder function.   A pelvic ultrasound in January 2020 revealed an anteverted uterus: 13.19 x 8.20 x 9.71 cm, endometrium: 6.18 mm with #5 fibroids ranging from  3.1-5.9 cm; right ovary-3.49 cm with left ovary not visualized.  The patient was given surgical and medical management options for her condition and symptoms so she decided to proceed with an endometrial ablation and tubal sterilization.  On January 31 the patient underwent an attempted Hydro-Thermal Ablation that failed.  Results of endometrial curettings at that time showed decidualized endometrium and no malignancy.  The tubal sterilization was also unable to be performed due to the size and location of her fibroids.  The patient was given Norethindrone 10 mg daily to take following her unsuccessful procedures to curtail her bleeding.  She reports that she is only spotting now.  After consideration of the failure of past medical  treatments and the persistent nature of her symptoms,  the patient has now decided to proceed with definitive therapy in the form of hysterectomy.   Past Medical History  OB History: G:2;  P: 2-0-0-2;  C-section-2009 and SVB-2011  GYN History: menarche: 38 YO    LMP: 07/31/2018    Contraception: none;   Denies history of abnormal PAP smear  or STDs.   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 Aspirin Other (See Comments)    Childhood allergy--reaction is unknown    Denies sensitivity to peanuts, shellfish, soy, latex or adhesives.  ROS: Admits to not feeling herself with narcotics-so tries not to take them but  denies headache, vision changes, nasal congestion, dysphagia, tinnitus, dizziness, hoarseness, cough,  chest pain, shortness of breath, nausea, vomiting, diarrhea,constipation,  urinary frequency, urgency  dysuria, hematuria, vaginitis symptoms,, swelling of joints,easy bruising,  myalgias, arthralgias, skin rashes, unexplained weight loss and except as is mentioned in  the history of present illness, patient's review of systems is otherwise negative.     Physical Exam  Bp: 116/70  P: 93 bpm  R: 16  Temperature: 98.4 degrees F orally;  Weight: 294 lbs.  Height: 5'5"  BMI: 48.9  O2Sat.: 98% room  air  Neck: supple without masses or thyromegaly Lungs: clear to auscultation Heart: regular rate and rhythm Abdomen: healing umbilical incision with no evidence of infection;  no palpable organomegaly Pelvic:EGBUS- wnl; vagina-normal rugae; uterus-16 weeks  size and irregular;   cervix without lesions or motion tenderness; adnexae-no tenderness or masses Extremities:  no clubbing, cyanosis or edema   Assesment: Menorrhagia                      Symptomatic Uterine Fibroids   Disposition:  A discussion was held with patient regarding the indication for her procedure(s) along with the risks, which include but are not limited to: reaction to anesthesia, damage to adjacent organs, infection and excessive bleeding. The patient verbalized understanding of these risks and has consented to proceed with an abdominal hysterectomy, bilateral salpingectomy with possible cystoscopy at [facility] on September 08, 2018.  CSN# [id number]   [place] J. Florene Glen, PA-C  for Dr. Harvie Bridge. Mancel Bale  R/B/A reviewed and consent signed and witnessed.  Questions answered.

## 2018-09-04 ENCOUNTER — Encounter (HOSPITAL_COMMUNITY): Payer: Self-pay

## 2018-09-04 ENCOUNTER — Encounter (HOSPITAL_COMMUNITY)
Admission: RE | Admit: 2018-09-04 | Discharge: 2018-09-04 | Disposition: A | Discharge status: Home / Self Care | Payer: BC Managed Care – PPO | Source: Ambulatory Visit | Attending: Obstetrics and Gynecology | Admitting: Obstetrics and Gynecology

## 2018-09-04 ENCOUNTER — Other Ambulatory Visit: Payer: Self-pay

## 2018-09-04 DIAGNOSIS — N920 Excessive and frequent menstruation with regular cycle: Secondary | ICD-10-CM | POA: Diagnosis not present

## 2018-09-04 DIAGNOSIS — D259 Leiomyoma of uterus, unspecified: Secondary | ICD-10-CM | POA: Insufficient documentation

## 2018-09-04 DIAGNOSIS — Z01812 Encounter for preprocedural laboratory examination: Secondary | ICD-10-CM | POA: Insufficient documentation

## 2018-09-04 LAB — CBC
HCT: 41 % (ref 36.0–46.0)
Hemoglobin: 11.9 g/dL — ABNORMAL LOW (ref 12.0–15.0)
MCH: 23.5 pg — ABNORMAL LOW (ref 26.0–34.0)
MCHC: 29 g/dL — ABNORMAL LOW (ref 30.0–36.0)
MCV: 80.9 fL (ref 80.0–100.0)
Platelets: 273 10*3/uL (ref 150–400)
RBC: 5.07 MIL/uL (ref 3.87–5.11)
RDW: 16.8 % — ABNORMAL HIGH (ref 11.5–15.5)
WBC: 8.9 10*3/uL (ref 4.0–10.5)
nRBC: 0 % (ref 0.0–0.2)

## 2018-09-04 NOTE — Progress Notes (Signed)
Anesthesia Chart Review   Case:  [id number] Date/Time:  09/08/18 1100   Procedure:  HYSTERECTOMY ABDOMINAL with Bilateral Salpingectomy (Bilateral )   Anesthesia type:  General   Pre-op diagnosis:  Uterine Fibroids   Location:  [facility] OR ROOM 3 / [place]   Surgeon:  Everett Graff, MD      DISCUSSION: 38 yo never smoker with h/o HTN, anemia, anxiety, uterine fibroids scheduled for above surgery 09/08/18 with Dr. Everett Graff.   Pt underwent general anesthesia 08/28/18 for D&C and attempted ablation.  Per anesthesia records pt has difficult airway due to large tongue.  No complications. BMI 49.17.  Pt currently scheduled at the surgery center.  Due to BMI and recent difficult airway recommend pt be rescheduled to main OR per surgery center guidelines.  Discussed with Dr. Smith Robert who agrees.  Voicemail left with Dr. Mancel Bale scheduler today.    VS: BP (!) 156/89   Pulse 70   Temp 37.4 C (Oral)   Resp 20   Ht 5\' 5"  (1.651 m)   Wt 134 kg   LMP 08/03/2018 (Exact Date) Comment: Spotting  SpO2 100%   BMI 49.17 kg/m   PROVIDERS: Iona Beard, MD is PCP    LABS: Labs reviewed: Acceptable for surgery. (all labs ordered are listed, but only abnormal results are displayed)   Labs pending  Labs Reviewed  CBC     IMAGES: Chest Xray 05/14/18 FINDINGS: Borderline cardiomegaly. Mild peribronchial thickening. No confluent opacities or effusions. No acute bony abnormality.  IMPRESSION: Borderline heart size.  Mild bronchitic changes.  EKG: 08/27/18 Rate 66 bpm Normal sinus rhythm Normal ECG No significant change since last tracing   CV:  Past Medical History:  Diagnosis Date  . Anemia    low iron  . Anxiety   . History of kidney stones   . Hypertension     Past Surgical History:  Procedure Laterality Date  . CESAREAN SECTION    . DILITATION & CURRETTAGE/HYSTROSCOPY WITH HYDROTHERMAL ABLATION N/A 08/28/2018   Procedure: DILATATION &  CURETTAGE/HYSTEROSCOPY, Attempted Hydrothermal Ablation, Attempted Novasure;  Surgeon: Everett Graff, MD;  Location: [facility];  Service: Gynecology;  Laterality: N/A;  . ear tubes    . LAPAROSCOPIC TUBAL LIGATION Bilateral 08/28/2018   Procedure: Attempted LAPAROSCOPIC TUBAL LIGATION;  Surgeon: Everett Graff, MD;  Location: [facility];  Service: Gynecology;  Laterality: Bilateral;  . TONSILLECTOMY      MEDICATIONS: . Cholecalciferol (VITAMIN D3) 1.25 MG (50000 UT) CAPS  . hydrochlorothiazide (HYDRODIURIL) 25 MG tablet  . NIFEdipine (ADALAT CC) 30 MG 24 hr tablet  . norethindrone (AYGESTIN) 5 MG tablet  . oxyCODONE-acetaminophen (PERCOCET) 5-325 MG tablet  . PARoxetine (PAXIL) 10 MG tablet   No current facility-administered medications for this encounter.     Maia Plan [facility] Pre-Surgical Testing [phone] 09/04/18 2:11 PM

## 2018-09-07 ENCOUNTER — Other Ambulatory Visit: Payer: Self-pay | Admitting: Obstetrics and Gynecology

## 2018-09-07 MED ORDER — DEXTROSE 5 % IV SOLN
3.0000 g | INTRAVENOUS | Status: AC
  Administered 2018-09-08: 3 g via INTRAVENOUS
  Administered 2018-09-08: 2 g via INTRAVENOUS
  Filled 2018-09-07: qty 3000
  Filled 2018-09-07: qty 3

## 2018-09-07 NOTE — Op Note (Signed)
Preop Diagnosis: Menorrhagia, Desire for sterilization   Postop Diagnosis: Menorrhagia, Desire for sterilization   Procedure: DILATATION & CURETTAGE/HYSTEROSCOPY WITH FAILED ABLATION and ABORTED LAPAROSCOPY   Anesthesia: General   Attending: Everett Graff, MD  Assistant:  Irene Shipper, CNM  Findings: 16wk size uterus, cervix dilated to about 2-3cm  Pathology: Endometrial Curretings  Fluids: See flowsheet  UOP: See flowsheet  EBL: See flowsheet  Complications: None  Procedure: The patient was taken to the operating room after the risks, benefits, alternatives, complications, treatment options, and expected outcomes were discussed with the patient. The patient verbalized understanding, the patient concurred with the proposed plan and consent signed and witnessed. The patient was taken to the Operating Room, identified as laparoscopic sterilization/hysteroscopy/D&C/Ablation. A Time Out was held and the above information confirmed.   A speculum was placed into the vagina and intrauterine manipulator place. 10 cc of  0.25% Marcaine injected at umbilicus and 12OF incision made infraumbilically.  Veress needle unable to be passed into the intrabdominal cavity.  Laparoscopic procedure aborted.  The skin was repaired with 3-0 monocryl via a subcuticular stitch and dermabond applied.  Attention turned to the perineum where intrauterine manipulator was removed.  A single tooth tenaculum was placed on the anterior lip of the cervix and cervix found to already be dilated.  Uterus sounded to 15cm.  Curettage was performed.  HTA introduced and good seal could not be obtained after placing additional tenaculums on cervix and vaseline gauze.  A second cartridge was used but did not pass the initial setup test.  Novasure was then introduced and cavity width was not able to get out of the red zone.  Ablation was aborted.  Sponge, lap and needle count was correct.  The patient was returned to the  recovery room in good condition.  Pt was informed of failures and she immediately stated she would now like a hysterectomy.  F/u in the office scheduled.

## 2018-09-07 NOTE — Progress Notes (Signed)
Contacted pt to advised that her surgery location had changed from [facility] to [facility]. Pt aware, as she had been contacted by her surgery office. Pt to report to [facility] Admitting at 9:00 AM.

## 2018-09-08 ENCOUNTER — Inpatient Hospital Stay (HOSPITAL_COMMUNITY)
Admission: RE | Admit: 2018-09-08 | Discharge: 2018-09-11 | DRG: 742 | Disposition: A | Discharge status: Home / Self Care | Payer: BC Managed Care – PPO | Attending: Obstetrics and Gynecology | Admitting: Obstetrics and Gynecology

## 2018-09-08 ENCOUNTER — Inpatient Hospital Stay (HOSPITAL_COMMUNITY): Payer: BC Managed Care – PPO | Admitting: Physician Assistant

## 2018-09-08 ENCOUNTER — Other Ambulatory Visit: Payer: Self-pay

## 2018-09-08 ENCOUNTER — Inpatient Hospital Stay (HOSPITAL_COMMUNITY): Payer: BC Managed Care – PPO | Admitting: Anesthesiology

## 2018-09-08 ENCOUNTER — Encounter (HOSPITAL_COMMUNITY): Payer: Self-pay | Admitting: *Deleted

## 2018-09-08 ENCOUNTER — Encounter (HOSPITAL_COMMUNITY)
Admission: RE | Disposition: A | Discharge status: Home / Self Care | Payer: Self-pay | Source: Home / Self Care | Attending: Obstetrics and Gynecology

## 2018-09-08 DIAGNOSIS — Z833 Family history of diabetes mellitus: Secondary | ICD-10-CM

## 2018-09-08 DIAGNOSIS — J302 Other seasonal allergic rhinitis: Secondary | ICD-10-CM | POA: Diagnosis present

## 2018-09-08 DIAGNOSIS — D251 Intramural leiomyoma of uterus: Principal | ICD-10-CM | POA: Diagnosis present

## 2018-09-08 DIAGNOSIS — Z8249 Family history of ischemic heart disease and other diseases of the circulatory system: Secondary | ICD-10-CM | POA: Diagnosis not present

## 2018-09-08 DIAGNOSIS — D259 Leiomyoma of uterus, unspecified: Secondary | ICD-10-CM | POA: Diagnosis present

## 2018-09-08 DIAGNOSIS — D509 Iron deficiency anemia, unspecified: Secondary | ICD-10-CM | POA: Diagnosis present

## 2018-09-08 DIAGNOSIS — L03316 Cellulitis of umbilicus: Secondary | ICD-10-CM | POA: Diagnosis not present

## 2018-09-08 DIAGNOSIS — I10 Essential (primary) hypertension: Secondary | ICD-10-CM | POA: Diagnosis present

## 2018-09-08 DIAGNOSIS — Y92230 Patient room in hospital as the place of occurrence of the external cause: Secondary | ICD-10-CM | POA: Diagnosis not present

## 2018-09-08 DIAGNOSIS — K660 Peritoneal adhesions (postprocedural) (postinfection): Secondary | ICD-10-CM | POA: Diagnosis present

## 2018-09-08 DIAGNOSIS — Z6841 Body Mass Index (BMI) 40.0 and over, adult: Secondary | ICD-10-CM

## 2018-09-08 DIAGNOSIS — T8141XA Infection following a procedure, superficial incisional surgical site, initial encounter: Secondary | ICD-10-CM | POA: Diagnosis not present

## 2018-09-08 DIAGNOSIS — Z886 Allergy status to analgesic agent status: Secondary | ICD-10-CM | POA: Diagnosis not present

## 2018-09-08 DIAGNOSIS — Y838 Other surgical procedures as the cause of abnormal reaction of the patient, or of later complication, without mention of misadventure at the time of the procedure: Secondary | ICD-10-CM | POA: Diagnosis not present

## 2018-09-08 DIAGNOSIS — Z87442 Personal history of urinary calculi: Secondary | ICD-10-CM | POA: Diagnosis not present

## 2018-09-08 DIAGNOSIS — F419 Anxiety disorder, unspecified: Secondary | ICD-10-CM | POA: Diagnosis present

## 2018-09-08 DIAGNOSIS — Z79899 Other long term (current) drug therapy: Secondary | ICD-10-CM

## 2018-09-08 HISTORY — PX: CYSTOSCOPY: SHX5120

## 2018-09-08 HISTORY — PX: ABDOMINAL HYSTERECTOMY: SHX81

## 2018-09-08 LAB — POCT I-STAT 4, (NA,K, GLUC, HGB,HCT)
Glucose, Bld: 132 mg/dL — ABNORMAL HIGH (ref 70–99)
HCT: 34 % — ABNORMAL LOW (ref 36.0–46.0)
Hemoglobin: 11.6 g/dL — ABNORMAL LOW (ref 12.0–15.0)
Potassium: 3.7 mmol/L (ref 3.5–5.1)
Sodium: 139 mmol/L (ref 135–145)

## 2018-09-08 LAB — CBC
HCT: 39.4 % (ref 36.0–46.0)
Hemoglobin: 11.4 g/dL — ABNORMAL LOW (ref 12.0–15.0)
MCH: 23.1 pg — ABNORMAL LOW (ref 26.0–34.0)
MCHC: 28.9 g/dL — AB (ref 30.0–36.0)
MCV: 79.8 fL — ABNORMAL LOW (ref 80.0–100.0)
Platelets: 243 10*3/uL (ref 150–400)
RBC: 4.94 MIL/uL (ref 3.87–5.11)
RDW: 16.7 % — AB (ref 11.5–15.5)
WBC: 16 10*3/uL — ABNORMAL HIGH (ref 4.0–10.5)
nRBC: 0 % (ref 0.0–0.2)

## 2018-09-08 LAB — PREGNANCY, URINE: Preg Test, Ur: NEGATIVE

## 2018-09-08 SURGERY — HYSTERECTOMY, ABDOMINAL
Anesthesia: General | Laterality: Bilateral

## 2018-09-08 MED ORDER — NIFEDIPINE ER OSMOTIC RELEASE 30 MG PO TB24
30.0000 mg | ORAL_TABLET | Freq: Every evening | ORAL | Status: DC
  Administered 2018-09-08 – 2018-09-10 (×3): 30 mg via ORAL
  Filled 2018-09-08 (×4): qty 1

## 2018-09-08 MED ORDER — FENTANYL CITRATE (PF) 100 MCG/2ML IJ SOLN
INTRAMUSCULAR | Status: AC
  Filled 2018-09-08: qty 2

## 2018-09-08 MED ORDER — GLYCOPYRROLATE 0.2 MG/ML IJ SOLN
INTRAMUSCULAR | Status: DC | PRN
  Administered 2018-09-08: 0.1 mg via INTRAVENOUS

## 2018-09-08 MED ORDER — ROCURONIUM BROMIDE 100 MG/10ML IV SOLN
INTRAVENOUS | Status: AC
  Filled 2018-09-08: qty 1

## 2018-09-08 MED ORDER — SCOPOLAMINE 1 MG/3DAYS TD PT72
MEDICATED_PATCH | TRANSDERMAL | Status: DC | PRN
  Administered 2018-09-08: 1 via TRANSDERMAL

## 2018-09-08 MED ORDER — PROPOFOL 10 MG/ML IV BOLUS
INTRAVENOUS | Status: AC
  Filled 2018-09-08: qty 20

## 2018-09-08 MED ORDER — NALOXONE HCL 0.4 MG/ML IJ SOLN: 0.4000 mg | INTRAMUSCULAR | Status: DC | PRN

## 2018-09-08 MED ORDER — ONDANSETRON HCL 4 MG/2ML IJ SOLN
INTRAMUSCULAR | Status: DC | PRN
  Administered 2018-09-08: 4 mg via INTRAVENOUS

## 2018-09-08 MED ORDER — MIDAZOLAM HCL 2 MG/2ML IJ SOLN
INTRAMUSCULAR | Status: AC
  Filled 2018-09-08: qty 2

## 2018-09-08 MED ORDER — SUGAMMADEX SODIUM 200 MG/2ML IV SOLN
INTRAVENOUS | Status: AC
  Filled 2018-09-08: qty 2

## 2018-09-08 MED ORDER — MIDAZOLAM HCL 2 MG/2ML IJ SOLN
INTRAMUSCULAR | Status: DC | PRN
  Administered 2018-09-08: 2 mg via INTRAVENOUS

## 2018-09-08 MED ORDER — SCOPOLAMINE 1 MG/3DAYS TD PT72
MEDICATED_PATCH | TRANSDERMAL | Status: AC
  Filled 2018-09-08: qty 1

## 2018-09-08 MED ORDER — LIP MEDEX EX OINT
TOPICAL_OINTMENT | CUTANEOUS | Status: AC
  Administered 2018-09-09: 1
  Filled 2018-09-08: qty 7

## 2018-09-08 MED ORDER — ESMOLOL HCL 100 MG/10ML IV SOLN
INTRAVENOUS | Status: DC | PRN
  Administered 2018-09-08: 60 mg via INTRAVENOUS

## 2018-09-08 MED ORDER — ONDANSETRON HCL 4 MG/2ML IJ SOLN: 4.0000 mg | Freq: Four times a day (QID) | INTRAMUSCULAR | Status: DC | PRN

## 2018-09-08 MED ORDER — DIPHENHYDRAMINE HCL 12.5 MG/5ML PO ELIX: 12.5000 mg | ORAL_SOLUTION | Freq: Four times a day (QID) | ORAL | Status: DC | PRN

## 2018-09-08 MED ORDER — HYDROMORPHONE 1 MG/ML IV SOLN
INTRAVENOUS | Status: DC
  Administered 2018-09-08: 30 mg via INTRAVENOUS
  Administered 2018-09-09: 0 mg via INTRAVENOUS
  Administered 2018-09-09: 1 mg via INTRAVENOUS
  Filled 2018-09-08 (×9): qty 30

## 2018-09-08 MED ORDER — OXYCODONE HCL 5 MG/5ML PO SOLN
5.0000 mg | Freq: Once | ORAL | Status: DC | PRN
  Filled 2018-09-08: qty 5

## 2018-09-08 MED ORDER — DOCUSATE SODIUM 100 MG PO CAPS
100.0000 mg | ORAL_CAPSULE | Freq: Two times a day (BID) | ORAL | Status: DC
  Administered 2018-09-08 – 2018-09-11 (×6): 100 mg via ORAL
  Filled 2018-09-08 (×6): qty 1

## 2018-09-08 MED ORDER — DIPHENHYDRAMINE HCL 50 MG/ML IJ SOLN: 12.5000 mg | Freq: Four times a day (QID) | INTRAMUSCULAR | Status: DC | PRN

## 2018-09-08 MED ORDER — ALBUMIN HUMAN 5 % IV SOLN
INTRAVENOUS | Status: DC | PRN
  Administered 2018-09-08: 15:00:00 via INTRAVENOUS

## 2018-09-08 MED ORDER — LIDOCAINE 2% (20 MG/ML) 5 ML SYRINGE
INTRAMUSCULAR | Status: AC
  Filled 2018-09-08: qty 5

## 2018-09-08 MED ORDER — 0.9 % SODIUM CHLORIDE (POUR BTL) OPTIME
TOPICAL | Status: DC | PRN
  Administered 2018-09-08: 2000 mL

## 2018-09-08 MED ORDER — HYDROCHLOROTHIAZIDE 25 MG PO TABS
25.0000 mg | ORAL_TABLET | Freq: Every day | ORAL | Status: DC
  Administered 2018-09-08 – 2018-09-10 (×3): 25 mg via ORAL
  Filled 2018-09-08 (×3): qty 1

## 2018-09-08 MED ORDER — MENTHOL 3 MG MT LOZG: 1.0000 | LOZENGE | OROMUCOSAL | Status: DC | PRN

## 2018-09-08 MED ORDER — FENTANYL CITRATE (PF) 250 MCG/5ML IJ SOLN
INTRAMUSCULAR | Status: DC | PRN
  Administered 2018-09-08 (×5): 50 ug via INTRAVENOUS
  Administered 2018-09-08: 100 ug via INTRAVENOUS

## 2018-09-08 MED ORDER — HYDRALAZINE HCL 20 MG/ML IJ SOLN
INTRAMUSCULAR | Status: AC
  Filled 2018-09-08: qty 1

## 2018-09-08 MED ORDER — HYDROMORPHONE HCL 1 MG/ML IJ SOLN
INTRAMUSCULAR | Status: DC | PRN
  Administered 2018-09-08 (×2): 1 mg via INTRAVENOUS

## 2018-09-08 MED ORDER — PROPOFOL 10 MG/ML IV BOLUS
INTRAVENOUS | Status: DC | PRN
  Administered 2018-09-08: 200 mg via INTRAVENOUS

## 2018-09-08 MED ORDER — CEFAZOLIN SODIUM-DEXTROSE 2-4 GM/100ML-% IV SOLN
INTRAVENOUS | Status: AC
  Filled 2018-09-08: qty 100

## 2018-09-08 MED ORDER — FENTANYL CITRATE (PF) 100 MCG/2ML IJ SOLN
25.0000 ug | INTRAMUSCULAR | Status: DC | PRN
  Administered 2018-09-08 (×3): 50 ug via INTRAVENOUS

## 2018-09-08 MED ORDER — PAROXETINE HCL 10 MG PO TABS
10.0000 mg | ORAL_TABLET | Freq: Every evening | ORAL | Status: DC
  Administered 2018-09-08 – 2018-09-10 (×3): 10 mg via ORAL
  Filled 2018-09-08 (×4): qty 1

## 2018-09-08 MED ORDER — ONDANSETRON HCL 4 MG PO TABS: 4.0000 mg | ORAL_TABLET | Freq: Four times a day (QID) | ORAL | Status: DC | PRN

## 2018-09-08 MED ORDER — BUPIVACAINE HCL (PF) 0.25 % IJ SOLN
INTRAMUSCULAR | Status: AC
  Filled 2018-09-08: qty 30

## 2018-09-08 MED ORDER — SIMETHICONE 80 MG PO CHEW: 80.0000 mg | CHEWABLE_TABLET | Freq: Four times a day (QID) | ORAL | Status: DC | PRN

## 2018-09-08 MED ORDER — FENTANYL CITRATE (PF) 250 MCG/5ML IJ SOLN
INTRAMUSCULAR | Status: AC
  Filled 2018-09-08: qty 5

## 2018-09-08 MED ORDER — ONDANSETRON HCL 4 MG/2ML IJ SOLN
INTRAMUSCULAR | Status: AC
  Filled 2018-09-08: qty 2

## 2018-09-08 MED ORDER — VASOPRESSIN 20 UNIT/ML IV SOLN
INTRAVENOUS | Status: AC
  Filled 2018-09-08: qty 1

## 2018-09-08 MED ORDER — LACTATED RINGERS IV SOLN: INTRAVENOUS | Status: DC

## 2018-09-08 MED ORDER — DEXAMETHASONE SODIUM PHOSPHATE 10 MG/ML IJ SOLN
INTRAMUSCULAR | Status: AC
  Filled 2018-09-08: qty 1

## 2018-09-08 MED ORDER — KETAMINE HCL 10 MG/ML IJ SOLN
INTRAMUSCULAR | Status: AC
  Filled 2018-09-08: qty 1

## 2018-09-08 MED ORDER — SODIUM CHLORIDE 0.9% FLUSH: 9.0000 mL | INTRAVENOUS | Status: DC | PRN

## 2018-09-08 MED ORDER — OXYCODONE HCL 5 MG PO TABS: 5.0000 mg | ORAL_TABLET | Freq: Once | ORAL | Status: DC | PRN

## 2018-09-08 MED ORDER — KETAMINE HCL 10 MG/ML IJ SOLN
INTRAMUSCULAR | Status: DC | PRN
  Administered 2018-09-08: 30 mg via INTRAVENOUS

## 2018-09-08 MED ORDER — SUGAMMADEX SODIUM 200 MG/2ML IV SOLN
INTRAVENOUS | Status: DC | PRN
  Administered 2018-09-08: 500 mg via INTRAVENOUS

## 2018-09-08 MED ORDER — IBUPROFEN 200 MG PO TABS
600.0000 mg | ORAL_TABLET | Freq: Four times a day (QID) | ORAL | Status: DC
  Administered 2018-09-08 – 2018-09-11 (×12): 600 mg via ORAL
  Filled 2018-09-08 (×12): qty 3

## 2018-09-08 MED ORDER — LIDOCAINE 2% (20 MG/ML) 5 ML SYRINGE
INTRAMUSCULAR | Status: DC | PRN
  Administered 2018-09-08: 60 mg via INTRAVENOUS

## 2018-09-08 MED ORDER — DEXAMETHASONE SODIUM PHOSPHATE 10 MG/ML IJ SOLN
INTRAMUSCULAR | Status: DC | PRN
  Administered 2018-09-08: 8 mg via INTRAVENOUS

## 2018-09-08 MED ORDER — ROCURONIUM BROMIDE 10 MG/ML (PF) SYRINGE
PREFILLED_SYRINGE | INTRAVENOUS | Status: DC | PRN
  Administered 2018-09-08: 10 mg via INTRAVENOUS
  Administered 2018-09-08: 20 mg via INTRAVENOUS
  Administered 2018-09-08 (×8): 10 mg via INTRAVENOUS
  Administered 2018-09-08: 60 mg via INTRAVENOUS

## 2018-09-08 MED ORDER — FENTANYL CITRATE (PF) 100 MCG/2ML IJ SOLN
INTRAMUSCULAR | Status: AC
  Administered 2018-09-08: 50 ug via INTRAVENOUS
  Filled 2018-09-08: qty 4

## 2018-09-08 MED ORDER — GLYCOPYRROLATE PF 0.2 MG/ML IJ SOSY
PREFILLED_SYRINGE | INTRAMUSCULAR | Status: AC
  Filled 2018-09-08: qty 1

## 2018-09-08 MED ORDER — LACTATED RINGERS IV SOLN
INTRAVENOUS | Status: DC
  Administered 2018-09-08 (×2): via INTRAVENOUS

## 2018-09-08 MED ORDER — HYDROMORPHONE HCL 2 MG/ML IJ SOLN
INTRAMUSCULAR | Status: AC
  Filled 2018-09-08: qty 1

## 2018-09-08 MED ORDER — OXYCODONE-ACETAMINOPHEN 5-325 MG PO TABS
1.0000 | ORAL_TABLET | Freq: Four times a day (QID) | ORAL | Status: DC | PRN
  Administered 2018-09-09 – 2018-09-10 (×2): 1 via ORAL
  Filled 2018-09-08 (×2): qty 1

## 2018-09-08 MED ORDER — VASOPRESSIN 20 UNIT/ML IV SOLN
INTRAVENOUS | Status: DC | PRN
  Administered 2018-09-08: 10 mL via INTRAMUSCULAR

## 2018-09-08 MED ORDER — LACTATED RINGERS IV SOLN
INTRAVENOUS | Status: DC
  Administered 2018-09-09: 02:00:00 via INTRAVENOUS

## 2018-09-08 MED ORDER — SODIUM CHLORIDE (PF) 0.9 % IJ SOLN
INTRAMUSCULAR | Status: AC
  Filled 2018-09-08: qty 50

## 2018-09-08 SURGICAL SUPPLY — 48 items
APL SKNCLS STERI-STRIP NONHPOA (GAUZE/BANDAGES/DRESSINGS) ×2
BARRIER ADHS 3X4 INTERCEED (GAUZE/BANDAGES/DRESSINGS) IMPLANT
BENZOIN TINCTURE PRP APPL 2/3 (GAUZE/BANDAGES/DRESSINGS) ×1 IMPLANT
BRR ADH 4X3 ABS CNTRL BYND (GAUZE/BANDAGES/DRESSINGS)
CATH FOLEY 2WAY SLVR  5CC 14FR (CATHETERS) ×1
CATH FOLEY 2WAY SLVR 5CC 14FR (CATHETERS) IMPLANT
CLEANER TIP ELECTROSURG 2X2 (MISCELLANEOUS) ×1 IMPLANT
CLSR STERI-STRIP ANTIMIC 1/2X4 (GAUZE/BANDAGES/DRESSINGS) ×1 IMPLANT
CONT PATH 16OZ SNAP LID 3702 (MISCELLANEOUS) ×2 IMPLANT
COVER WAND RF STERILE (DRAPES) IMPLANT
DECANTER SPIKE VIAL GLASS SM (MISCELLANEOUS) IMPLANT
DISSECTOR SPONGE CHERRY (GAUZE/BANDAGES/DRESSINGS) ×3 IMPLANT
DRAPE WARM FLUID 44X44 (DRAPE) IMPLANT
DRSG OPSITE POSTOP 4X10 (GAUZE/BANDAGES/DRESSINGS) ×3 IMPLANT
DRSG TEGADERM 2-3/8X2-3/4 SM (GAUZE/BANDAGES/DRESSINGS) ×1 IMPLANT
DURAPREP 26ML APPLICATOR (WOUND CARE) ×3 IMPLANT
GAUZE 4X4 16PLY RFD (DISPOSABLE) ×4 IMPLANT
GLOVE BIO SURGEON STRL SZ7.5 (GLOVE) ×3 IMPLANT
GLOVE BIOGEL PI IND STRL 7.0 (GLOVE) ×4 IMPLANT
GLOVE BIOGEL PI IND STRL 7.5 (GLOVE) ×2 IMPLANT
GLOVE BIOGEL PI INDICATOR 7.0 (GLOVE) ×2
GLOVE BIOGEL PI INDICATOR 7.5 (GLOVE) ×1
GOWN STRL REUS W/TWL LRG LVL3 (GOWN DISPOSABLE) ×9 IMPLANT
HEMOSTAT SURGICEL 4X8 (HEMOSTASIS) IMPLANT
NEEDLE HYPO 22GX1.5 SAFETY (NEEDLE) IMPLANT
NS IRRIG 1000ML POUR BTL (IV SOLUTION) ×3 IMPLANT
PACK ABDOMINAL GYN (CUSTOM PROCEDURE TRAY) ×3 IMPLANT
PAD OB MATERNITY 4.3X12.25 (PERSONAL CARE ITEMS) ×2 IMPLANT
PAD TELFA 2X3 NADH STRL (GAUZE/BANDAGES/DRESSINGS) ×1 IMPLANT
PROTECTOR NERVE ULNAR (MISCELLANEOUS) ×3 IMPLANT
SET CYSTO W/LG BORE CLAMP LF (SET/KITS/TRAYS/PACK) IMPLANT
SHEET LAVH (DRAPES) ×3 IMPLANT
SPONGE LAP 18X18 RF (DISPOSABLE) ×1 IMPLANT
SUT CHROMIC 2 0 CT 1 (SUTURE) ×2 IMPLANT
SUT CHROMIC 2 0 SH (SUTURE) ×1 IMPLANT
SUT MNCRL AB 3-0 PS2 27 (SUTURE) ×2 IMPLANT
SUT MON AB 3-0 SH 27 (SUTURE) ×3
SUT MON AB 3-0 SH27 (SUTURE) IMPLANT
SUT PDS AB 1 CTX 36 (SUTURE) ×2 IMPLANT
SUT PLAIN 2 0 XLH (SUTURE) ×3 IMPLANT
SUT VIC AB 0 CT1 18XCR BRD8 (SUTURE) ×4 IMPLANT
SUT VIC AB 0 CT1 27 (SUTURE) ×6
SUT VIC AB 0 CT1 27XBRD ANBCTR (SUTURE) ×4 IMPLANT
SUT VIC AB 0 CT1 8-18 (SUTURE) ×12
SUT VICRYL 0 TIES 12 18 (SUTURE) ×3 IMPLANT
SYR CONTROL 10ML LL (SYRINGE) IMPLANT
TOWEL OR 17X26 10 PK STRL BLUE (TOWEL DISPOSABLE) ×6 IMPLANT
TRAY FOLEY MTR SLVR 14FR STAT (SET/KITS/TRAYS/PACK) ×3 IMPLANT

## 2018-09-08 NOTE — Anesthesia Procedure Notes (Addendum)
Procedure Name: Intubation Date/Time: 09/08/2018 1:16 PM Performed by: Eben Burow, CRNA Pre-anesthesia Checklist: Patient identified, Emergency Drugs available, Suction available, Patient being monitored and Timeout performed Patient Re-evaluated:Patient Re-evaluated prior to induction Oxygen Delivery Method: Circle system utilized Preoxygenation: Pre-oxygenation with 100% oxygen Induction Type: IV induction Ventilation: Mask ventilation without difficulty Laryngoscope Size: Glidescope and 4 Grade View: Grade I Tube type: Oral Tube size: 7.0 mm Number of attempts: 1 Airway Equipment and Method: Stylet and Video-laryngoscopy Placement Confirmation: ETT inserted through vocal cords under direct vision,  positive ETCO2 and breath sounds checked- equal and bilateral Secured at: 23 cm Tube secured with: Tape Dental Injury: Teeth and Oropharynx as per pre-operative assessment

## 2018-09-08 NOTE — Transfer of Care (Signed)
Immediate Anesthesia Transfer of Care Note  Patient: Deborah Garza  Procedure(s) Performed: HYSTERECTOMY ABDOMINAL with Bilateral Salpingectomy (Bilateral ) CYSTOSCOPY  Patient Location: PACU  Anesthesia Type:General  Level of Consciousness: awake, alert , oriented and patient cooperative  Airway & Oxygen Therapy: Patient Spontanous Breathing and Patient connected to face mask oxygen  Post-op Assessment: Report given to RN, Post -op Vital signs reviewed and stable and Patient moving all extremities  Post vital signs: Reviewed and stable  Last Vitals:  Vitals Value Taken Time  BP 137/75 09/08/2018  5:30 PM  Temp    Pulse 85 09/08/2018  5:35 PM  Resp 16 09/08/2018  5:35 PM  SpO2 100 % 09/08/2018  5:35 PM  Vitals shown include unvalidated device data.  Last Pain:  Vitals:   09/08/18 1042  TempSrc: Oral  PainSc: 0-No pain         Complications: No apparent anesthesia complications

## 2018-09-08 NOTE — Interval H&P Note (Signed)
History and Physical Interval Note:  09/08/2018 12:48 PM  Deborah Garza  has presented today for surgery, with the diagnosis of Uterine Fibroids  The various methods of treatment have been discussed with the patient and family. After consideration of risks, benefits and other options for treatment, the patient has consented to  Procedure(s): HYSTERECTOMY ABDOMINAL with Bilateral Salpingectomy (Bilateral) and possible cystoscopy as a surgical intervention .  The patient's history has been reviewed, patient examined, no change in status, stable for surgery.  I have reviewed the patient's chart and labs.  Questions were answered to the patient's satisfaction.     Delice Lesch

## 2018-09-08 NOTE — Anesthesia Preprocedure Evaluation (Signed)
Anesthesia Evaluation  Patient identified by MRN, date of birth, ID band Patient awake    Reviewed: Allergy & Precautions, H&P , NPO status , Patient's Chart, lab work & pertinent test results  Airway Mallampati: II   Neck ROM: full    Dental   Pulmonary neg pulmonary ROS,    breath sounds clear to auscultation       Cardiovascular hypertension,  Rhythm:regular Rate:Normal     Neuro/Psych PSYCHIATRIC DISORDERS Anxiety    GI/Hepatic   Endo/Other  Morbid obesity  Renal/GU stones     Musculoskeletal   Abdominal   Peds  Hematology   Anesthesia Other Findings   Reproductive/Obstetrics Uterine fibroids                             Anesthesia Physical Anesthesia Plan  ASA: II  Anesthesia Plan: General   Post-op Pain Management:    Induction: Intravenous  PONV Risk Score and Plan: 3 and Ondansetron, Dexamethasone, Midazolam and Treatment may vary due to age or medical condition  Airway Management Planned: Oral ETT  Additional Equipment:   Intra-op Plan:   Post-operative Plan: Extubation in OR  Informed Consent: I have reviewed the patients History and Physical, chart, labs and discussed the procedure including the risks, benefits and alternatives for the proposed anesthesia with the patient or authorized representative who has indicated his/her understanding and acceptance.       Plan Discussed with: CRNA, Anesthesiologist and Surgeon  Anesthesia Plan Comments:         Anesthesia Quick Evaluation

## 2018-09-09 ENCOUNTER — Encounter (HOSPITAL_COMMUNITY): Payer: Self-pay | Admitting: Obstetrics and Gynecology

## 2018-09-09 LAB — CBC
HCT: 34.9 % — ABNORMAL LOW (ref 36.0–46.0)
Hemoglobin: 10.5 g/dL — ABNORMAL LOW (ref 12.0–15.0)
MCH: 23.8 pg — ABNORMAL LOW (ref 26.0–34.0)
MCHC: 30.1 g/dL (ref 30.0–36.0)
MCV: 79 fL — ABNORMAL LOW (ref 80.0–100.0)
Platelets: 255 10*3/uL (ref 150–400)
RBC: 4.42 MIL/uL (ref 3.87–5.11)
RDW: 16.4 % — AB (ref 11.5–15.5)
WBC: 15.9 10*3/uL — ABNORMAL HIGH (ref 4.0–10.5)
nRBC: 0 % (ref 0.0–0.2)

## 2018-09-09 LAB — BASIC METABOLIC PANEL
Anion gap: 9 (ref 5–15)
BUN: 12 mg/dL (ref 6–20)
CALCIUM: 8.2 mg/dL — AB (ref 8.9–10.3)
CO2: 25 mmol/L (ref 22–32)
Chloride: 102 mmol/L (ref 98–111)
Creatinine, Ser: 0.81 mg/dL (ref 0.44–1.00)
GFR calc Af Amer: >60 mL/min (ref >60)
GFR calc non Af Amer: >60 mL/min (ref >60)
Glucose, Bld: 124 mg/dL — ABNORMAL HIGH (ref 70–99)
Potassium: 3.3 mmol/L — ABNORMAL LOW (ref 3.5–5.1)
Sodium: 136 mmol/L (ref 135–145)

## 2018-09-09 MED ORDER — LIP MEDEX EX OINT
TOPICAL_OINTMENT | CUTANEOUS | Status: AC
  Filled 2018-09-09: qty 7

## 2018-09-09 MED ORDER — CEPHALEXIN 500 MG PO CAPS
500.0000 mg | ORAL_CAPSULE | Freq: Two times a day (BID) | ORAL | Status: DC
  Administered 2018-09-09 – 2018-09-11 (×4): 500 mg via ORAL
  Filled 2018-09-09 (×5): qty 1

## 2018-09-09 MED ORDER — ENOXAPARIN SODIUM 80 MG/0.8ML ~~LOC~~ SOLN
65.0000 mg | SUBCUTANEOUS | Status: DC
  Administered 2018-09-09 – 2018-09-10 (×2): 65 mg via SUBCUTANEOUS
  Filled 2018-09-09 (×2): qty 0.8

## 2018-09-09 NOTE — Progress Notes (Signed)
ANTICOAGULATION CONSULT NOTE - Initial Consult  Pharmacy Consult for Lovenox Indication: VTE prophylaxis  Allergies  Allergen Reactions  . Aspirin Other (See Comments)    Childhood allergy--reaction is unknown    Patient Measurements: Height: 5\' 5"  (165.1 cm) Weight: 295 lb 8 oz (134 kg) IBW/kg (Calculated) : 57  Vital Signs: Temp: 98.6 F (37 C) (02/12 1346) Temp Source: Oral (02/12 1346) BP: 114/71 (02/12 1346) Pulse Rate: 77 (02/12 1346)  Labs: Recent Labs    09/08/18 1557 09/08/18 1851 09/09/18 0454  HGB 11.6* 11.4* 10.5*  HCT 34.0* 39.4 34.9*  PLT  --  243 255  CREATININE  --   --  0.81    Estimated Creatinine Clearance: 129.2 mL/min (by C-G formula based on SCr of 0.81 mg/dL).   Medical History: Past Medical History:  Diagnosis Date  . Anemia    low iron  . Anxiety   . History of kidney stones   . Hypertension      Assessment: 38 yr female s/p abdominal hysterectomy with bilateral salpingectomy on 2/11.  SCDs ordered postop for DVT prophylaxis with plans to start Lovenox 24 hours post op (AET 09/08/18 @ 17:36)  Pharmacy consulted today to dose Lovenox for VTE prophylaxis  BMI = 49; therefore will dose Lovenox according to manufacturer recommendations of 0.5 mg/kg/q24h for BMI > 30 when CrCl > 30 ml/min  Goal of Therapy:  VTE prophylaxis coverage   Plan:  Lovenox 65mg  sq q24h (will begin @ 22:00 tonight). Need for further dosage adjustment appears unlikely at present.    Will sign off at this time.  Please reconsult if a change in clinical status warrants re-evaluation of dosage.   Keric Zehren, Toribio Harbour, PharmD 09/09/2018,5:14 PM

## 2018-09-09 NOTE — Op Note (Signed)
Preop Diagnosis: Symptomatic Uterine Fibroids   Postop Diagnosis: Symptomatic Uterine Fibroids   Procedure: 1.HYSTERECTOMY ABDOMINAL 2.BILATERAL SALPINGECTOMY 3.CYSTOSCOPY  Anesthesia: General   Attending: Everett Graff, MD   Assistant: E.Florene Glen, PA-C  Findings: 16wk size uterus, omental adhesion to posterior uterine wall, nl appearing bilateral ovaries.  Pathology: Uterus, cervix, 7cm fibroid and portions bilateral tubes  Fluids: 3 liters IVF 1 bottle (250cc) albumin  UOP: 250 cc  EBL: 245 cc  Complications: None  Procedure: The patient received intravenous antibiotics and had sequential compression devices applied to her lower extremities while in the preoperative area.   She was taken to the operating room and placed under general anesthesia without difficulty.The abdomen and perineum were prepped and draped in a sterile manner, and she was placed in a dorsal lithotomy position.  A Foley catheter was inserted into the bladder and attached to constant drainage. After an adequate timeout was performed, a Pfannensteil skin incision was made. This incision was taken down to the fascia using electrocautery with care given to maintain good hemostasis. The fascia was incised in the midline and the fascial incision was then extended bilaterally using electrocautery without difficulty. The fascia was then dissected off the underlying rectus muscles using blunt and sharp dissection. The rectus muscles were split bluntly in the midline and the peritoneum entered bluntly without complication. This peritoneal incision was then extended superiorly and inferiorly with care given to prevent bowel or bladder injury. Attention was then turned to the pelvis. A retractor was placed into the incision, and the bowel was packed away with moist laparotomy sponges. The uterus at this point was noted to be mobilized and was delivered up out of the abdomen. The round ligaments on each side were clamped,  suture ligated with 0 Vicryl, and transected with electrocautery allowing entry into the broad ligament. Of note, all sutures used in this procedure are 0 Vicryl unless otherwise noted.   A hole was created in the clear portion of the posterior broad ligament and the utero-ovarian ligament was clamped on the patient's left side, cut, and doubly suture ligated with good hemostasis.  This procedure was repeated in an identical fashion on the opposite side.  A bladder flap was attempted but anterior wall fibroid in lower uterine segment prevented adequate visualization.  20 units of pitressin in 50 cc 0.9% NACL was injected into the anterior uterine wall over the fibroid using a total of 15cc.  The bladder was then bluntly dissected off the lower uterine segment and cervix with good hemostasis noted. The uterine arteries were then skeletonized bilaterally and then clamped, cut, and doubly suture ligated with care given to prevent ureteral injury.  The uterus was then amputated across the lower uterine segment.  The uterosacral ligaments were then clamped, cut, and ligated bilaterally.  Finally, the cardinal ligaments were clamped, cut, and suture ligated bilaterally.  Acutely curved clamps were placed across the vagina just under the cervix, and the specimen was amputated and sent to pathology. The vaginal cuff angles were closed with Heaney stiches with care given to incorporate the uterosacral-cardinal ligament pedicles on both sides. The middle of the vaginal cuff was closed with a series of interrupted figure-of-eight sutures with care given to incorporate the anterior pubocervical fascia and the posterior rectovaginal fascia.   The pelvis was irrigated and hemostasis was reconfirmed at all pedicles and along the pelvic sidewall.  The left fallopian tube was then elevated with a babcock clamped immediately beneath it at the mesosalpinx, cut  and suture ligated. The angle stitches were tied.  All laparotomy sponges  and instruments were removed from the abdomen. Attention was then turned to the area just beneath the umbilicus to examine the indurated area at umbilicus from previous incision from failed laparoscopy on 08/28/2018.  Due to pannus and distance from pfannenstiel incision, it was unable to be visualized adequately but a question of omental vs bowel adhesions noted. The peritoneum was closed with a running stitch of 2-0 chromic, and the fascia was closed in a running fashion with 0 vicryl. The subcutaneous layer was reapproximated with 2-0 plain with 3 interrupted stitches. The skin was closed with a 3-0 monocryl subcuticular stitch. Half inch steristrips with bezoin and honeycomb dressing was applied.  Attention was then turned to the perineum where cystoscopy was performed and efflux from both ureters noted.  A large graves speculum was used to visualize the cuff and at the left angle there was some bleeding noted and the vaginal mucosa was not incorporated wholly in the stitches.  A stitch was placed at this angle to reapproximate the mucosal edges using 0 vidryl and hemostasis was assured. Sponge, lap, needle, and instrument counts were correct times two. The patient was taken to the recovery area awake, extubated and in stable condition.

## 2018-09-09 NOTE — Progress Notes (Signed)
PCA pump d/c. Wasted diluadid 28mg  in [employer]. Witness RN Andee Poles

## 2018-09-09 NOTE — Progress Notes (Addendum)
Deborah Garza is a65 y.o.  [id number]  Post Op Date # 1:  TAH/BS/Cystoscopy  Subjective: Patient is Doing well postoperatively. Patient has Pain is controlled with current analgesics. Medications being used: PCA Dilaudid..the patient has ambulated without dizziness in the room,  is tolerating water and crackers (asking for regular food), denies any nausea,  has passed flatus but has not voided since Foley was removed 30 minutes ago.   Objective: Vital signs in last 24 hours: Temp:  [98.2 F (36.8 C)-99.5 F (37.5 C)] 99.5 F (37.5 C) (02/12 0605) Pulse Rate:  [70-92] 77 (02/12 0605) Resp:  [14-20] 16 (02/12 0605) BP: (117-151)/(69-93) 123/69 (02/12 0605) SpO2:  [96 %-100 %] 100 % (02/12 0605) Weight:  [134 kg] 134 kg (02/11 2301)  Intake/Output from previous day: 02/11 0701 - 02/12 0700 In: 5010.5 [P.O.:300; I.V.:4360.5] Out: 1800 [Urine:1200] Intake/Output this shift: No intake/output data recorded. Recent Labs  Lab 09/04/18 1332 09/08/18 1557 09/08/18 1851 09/09/18 0454  WBC 8.9  --  16.0* 15.9*  HGB 11.9* 11.6* 11.4* 10.5*  HCT 41.0 34.0* 39.4 34.9*  PLT 273  --  243 255     Recent Labs  Lab 09/08/18 1557 09/09/18 0454  NA 139 136  K 3.7 3.3*  CL  --  102  CO2  --  25  BUN  --  12  CREATININE  --  0.81  CALCIUM  --  8.2*  GLUCOSE 132* 124*    EXAM: General: alert, cooperative, no distress and patient very talkative and jovial. Resp: clear to auscultation bilaterally Cardio: regular rate and rhythm, S1, S2 normal, no murmur, click, rub or gallop GI: bowel sounds present in all 4 quadrants;  umbilial dressing is clean/dry/intact; lower abdominal incision has good wound edge approximation with no evidence of infection, steri-strips and dressing removed and new strips and dressing applied. Extremities: Homans sign is negative, no sign of DVT and no calf tenderness; SCD hose reapplied once the patient got back in bed.   Assessment: s/p  Procedure(s): HYSTERECTOMY ABDOMINAL with Bilateral Salpingectomy CYSTOSCOPY: stable, progressing well and anemia  Plan: Advance diet Encourage ambulation Advance to PO medication Routine care.  Consideration of an Abdominal-Pelvic CT scan to evaluate umbilical wound for abscess and to rule out bowel injury from previous laparoscopic surgery on 08/28/2018.  LOS: 1 day    Earnstine Regal, PA-C 09/09/2018 7:45 AM  Pt reports umbilical incision is starting to drain.  She has some abdominal wall soreness but that is mostly it.  She has tolerated regular food without nausea or vomiting and is ambulating without difficulty.  I discussed at length the r/b/a of doing the CT scan and pt is in agreement that holding off for now is what she would like to do.  The area around umbilicus is softer and draining.  I will start keflex d/t cellulitis and possible abscess at umbilical incision.  Will start lovenox for DVT prophylaxis as well.  Pt instructed to remove scopolamine patch this evening and precautions given.  Both incisions redressed.  Steristrips were replaced this morning.  Recheck labs in am.  Pt only used the dilaudid PCA once.  She says the ibuprofen is working well but she may take a Geophysicist/field seismologist before bed.  I explained the benefits of staying ahead of her pain for adequate pain relief.  Pt verbalized understanding and all questions answered.

## 2018-09-09 NOTE — Progress Notes (Signed)
1 Day Post-Op Procedure(s) (LRB): HYSTERECTOMY ABDOMINAL with Bilateral Salpingectomy (Bilateral) CYSTOSCOPY  Subjective: Patient seen post op in the PACU and reports crampiness and discomfort on the right.  Otherwise, no complaints.  Objective: I have reviewed patient's vital signs and intake and output.  General: alert and no distress Resp: clear to auscultation bilaterally Cardio: regular rate and rhythm Abdomen/GI: appropriately tender, ND, no BS, incision c/d and dressing intact x 2 Extremities: no calf tenderness, SCDs are on Vaginal Bleeding: none  Assessment: s/p Procedure(s): HYSTERECTOMY ABDOMINAL with Bilateral Salpingectomy (Bilateral) CYSTOSCOPY: stable  Plan: Continue NPO  Labs in am and stable post op CBC Encourage IS OOB to chair later this evening SCDs for DVT prophylaxis.  Will start lovenox for DVT prophylaxis 24hrs post op Discussed possibility of doing CT scan to evaluate induration at umbilicus  LOS: 1 day    Delice Lesch 09/09/2018, 4:34 PM

## 2018-09-10 LAB — BASIC METABOLIC PANEL WITH GFR
Anion gap: 7 (ref 5–15)
BUN: 14 mg/dL (ref 6–20)
CO2: 28 mmol/L (ref 22–32)
Calcium: 7.6 mg/dL — ABNORMAL LOW (ref 8.9–10.3)
Chloride: 104 mmol/L (ref 98–111)
Creatinine, Ser: 0.92 mg/dL (ref 0.44–1.00)
GFR calc Af Amer: >60 mL/min (ref >60)
GFR calc non Af Amer: >60 mL/min (ref >60)
Glucose, Bld: 94 mg/dL (ref 70–99)
Potassium: 3.2 mmol/L — ABNORMAL LOW (ref 3.5–5.1)
Sodium: 139 mmol/L (ref 135–145)

## 2018-09-10 LAB — CBC
HCT: 32 % — ABNORMAL LOW (ref 36.0–46.0)
Hemoglobin: 9.4 g/dL — ABNORMAL LOW (ref 12.0–15.0)
MCH: 23.6 pg — ABNORMAL LOW (ref 26.0–34.0)
MCHC: 29.4 g/dL — ABNORMAL LOW (ref 30.0–36.0)
MCV: 80.4 fL (ref 80.0–100.0)
PLATELETS: 213 10*3/uL (ref 150–400)
RBC: 3.98 MIL/uL (ref 3.87–5.11)
RDW: 16.7 % — ABNORMAL HIGH (ref 11.5–15.5)
WBC: 9.1 10*3/uL (ref 4.0–10.5)
nRBC: 0 % (ref 0.0–0.2)

## 2018-09-10 MED ORDER — POTASSIUM CHLORIDE CRYS ER 20 MEQ PO TBCR
40.0000 meq | EXTENDED_RELEASE_TABLET | Freq: Every day | ORAL | Status: DC
  Administered 2018-09-10 – 2018-09-11 (×2): 40 meq via ORAL
  Filled 2018-09-10 (×2): qty 2

## 2018-09-10 NOTE — Progress Notes (Addendum)
Deborah Garza is a3 y.o.  [id number]  Post Op Date # 2:  TAH/BS/Cystoscopy  Subjective: Patient is Doing well postoperatively. Patient has Pain is controlled with current analgesics. Medications being used: narcotic analgesics including Percocet 5/325 mg. Ambulating in the halls without difficulty, voiding and passing flatus.  Tolerating a regular diet.  Patient denies dizziness, nausea or other complaints.   Objective: Vital signs in last 24 hours: Temp:  [98.3 F (36.8 C)-99.3 F (37.4 C)] 98.6 F (37 C) (02/13 1962) Pulse Rate:  [72-82] 72 (02/13 0611) Resp:  [14-17] 16 (02/13 0611) BP: (114-151)/(71-77) 133/76 (02/13 0611) SpO2:  [94 %-100 %] 95 % (02/13 0611)  Intake/Output from previous day: 02/12 0701 - 02/13 0700 In: 1479.8 [P.O.:1080; I.V.:399.8] Out: 600 [Urine:600] Intake/Output this shift: Total I/O In: 360 [P.O.:360] Out: 0  Recent Labs  Lab 09/08/18 1851 09/09/18 0454 09/10/18 0442  WBC 16.0* 15.9* 9.1  HGB 11.4* 10.5* 9.4*  HCT 39.4 34.9* 32.0*  PLT 243 255 213     Recent Labs  Lab 09/08/18 1557 09/09/18 0454 09/10/18 0442  NA 139 136 139  K 3.7 3.3* 3.2*  CL  --  102 104  CO2  --  25 28  BUN  --  12 14  CREATININE  --  0.81 0.92  CALCIUM  --  8.2* 7.6*  GLUCOSE 132* 124* 94    EXAM: General: alert, cooperative and no distress Resp: clear to auscultation bilaterally Cardio: regular rate and rhythm, S1, S2 normal, no murmur, click, rub or gallop GI: bowel sounds present;  umbilical dressing is clean/dry/intact; lower abdominal honeycomb dressing is dry and intact with several spots of dried blood stain along suture line. Extremities: Homans sign is negative, no sign of DVT and no calf tenderness.   Assessment: s/p Procedure(s): HYSTERECTOMY ABDOMINAL with Bilateral Salpingectomy CYSTOSCOPY: stable, progressing well, tolerating diet and anemia  Plan: Routine care  LOS: 2 days    Earnstine Regal, PA-C 09/10/2018 6:53 AM   Pt doing  well.  Incisions c/d/i.  Ambulating well.  Voiding without difficulty and tolerating regular diet without any N/V.  Good BS, ND and minimal tenderness.  Area at umbilicus continues to improve.  K+ low, will replete with k-dur.

## 2018-09-10 NOTE — Anesthesia Postprocedure Evaluation (Signed)
Anesthesia Post Note  Patient: Deborah Garza  Procedure(s) Performed: HYSTERECTOMY ABDOMINAL with Bilateral Salpingectomy (Bilateral ) CYSTOSCOPY     Patient location during evaluation: PACU Anesthesia Type: General Level of consciousness: awake and alert Pain management: pain level controlled Vital Signs Assessment: post-procedure vital signs reviewed and stable Respiratory status: spontaneous breathing, nonlabored ventilation, respiratory function stable and patient connected to nasal cannula oxygen Cardiovascular status: blood pressure returned to baseline and stable Postop Assessment: no apparent nausea or vomiting Anesthetic complications: no    Last Vitals:  Vitals:   09/10/18 0611 09/10/18 1426  BP: 133/76 132/79  Pulse: 72 79  Resp: 16 18  Temp: 37 C 36.8 C  SpO2: 95% 99%    Last Pain:  Vitals:   09/10/18 1426  TempSrc: Oral  PainSc:                  [place]

## 2018-09-10 NOTE — Discharge Instructions (Signed)
Call [facility] @ [phone] if:  You have a temperature greater than or equal to 100.4 degrees Farenheit orally You have pain that is not made better by the pain medication given and taken as directed You have excessive bleeding or problems urinating  Take Colace (Docusate Sodium/Stool Softener) 100 mg 2-3 times daily while taking narcotic pain medicine to avoid constipation or until bowel movements are regular. Take Ibuprofen 600 mg with food every 6 hours for 5 days then as needed for pain Take an over the counter iron supplement of your choice,  twice a day for 6 months  You may drive after 2  weeks You may walk up steps  You may shower  You may resume a regular diet  Keep incisions clean and dry; remove honeycomb dressing on September 15, 2018 Do not lift over 15 pounds for 6 weeks Avoid anything in vagina for 6 weeks (or until after your post-operative visit)

## 2018-09-11 MED ORDER — OXYCODONE-ACETAMINOPHEN 5-325 MG PO TABS: ORAL_TABLET | ORAL | 0 refills | Status: DC

## 2018-09-11 MED ORDER — CEPHALEXIN 500 MG PO CAPS: 500.0000 mg | ORAL_CAPSULE | Freq: Two times a day (BID) | ORAL | 0 refills | Status: DC

## 2018-09-11 MED ORDER — IBUPROFEN 600 MG PO TABS: ORAL_TABLET | ORAL | 1 refills | Status: DC

## 2018-09-11 NOTE — Progress Notes (Addendum)
Deborah Garza is a2 y.o.  [id number]  Post Op Date # 2: TAH/BS/Cystoscopy  Subjective: Patient is Doing well postoperatively. Patient has Pain is controlled with current analgesics. Medications being used: prescription NSAID's including Ibuprofen 600 mg and narcotic analgesics including Percocet 5/325 mg. Patient had a BM this morning. Continues to tolerate a regular diet, ambulate and void without difficulty.  Objective: Vital signs in last 24 hours: Temp:  [98.2 F (36.8 C)-98.3 F (36.8 C)] 98.3 F (36.8 C) (02/14 0529) Pulse Rate:  [75-79] 76 (02/14 0529) Resp:  [15-18] 16 (02/14 0529) BP: (116-132)/(51-79) 125/75 (02/14 0529) SpO2:  [96 %-99 %] 99 % (02/14 0529) Weight:  [137.8 kg] 137.8 kg (02/14 0529)  Intake/Output from previous day: 02/13 0701 - 02/14 0700 In: 1420 [P.O.:1420] Out: 300 [Urine:300] Intake/Output this shift: No intake/output data recorded. Recent Labs  Lab 09/08/18 1851 09/09/18 0454 09/10/18 0442  WBC 16.0* 15.9* 9.1  HGB 11.4* 10.5* 9.4*  HCT 39.4 34.9* 32.0*  PLT 243 255 213     Recent Labs  Lab 09/08/18 1557 09/09/18 0454 09/10/18 0442  NA 139 136 139  K 3.7 3.3* 3.2*  CL  --  102 104  CO2  --  25 28  BUN  --  12 14  CREATININE  --  0.81 0.92  CALCIUM  --  8.2* 7.6*  GLUCOSE 132* 124* 94    EXAM: General: alert, cooperative and no distress Resp: clear to auscultation bilaterally Cardio: regular rate and rhythm, S1, S2 normal, no murmur, click, rub or gallop GI: Bowel sounds present, soft, umbilical dressing clean/dry/intact; lower abdominal dressing dry/intact with no additional dried stains. Extremities: Homans sign is negative, no sign of DVT and no calf tenderness. Vaginal Bleeding: minimal   Assessment: s/p Procedure(s): HYSTERECTOMY ABDOMINAL with Bilateral Salpingectomy CYSTOSCOPY: stable, progressing well, tolerating diet and anemia  Plan: Routine care and consider discharge home.  LOS: 3 days    Earnstine Regal, PA-C 09/11/2018 7:31 AM  Doing well.  NABS with BM this morning and passing flatus.  Area at umbilicus continues to improve.  Dressings are c/d/i.  D/c instructions given and questions answered.  Call with chills, fever, n/v, change in BM or not continuing to improve daily.  Will cont keflex for another 5 days d/t area at umbilicus.

## 2018-09-11 NOTE — Discharge Summary (Signed)
Physician Discharge Summary  Patient ID: Deborah Garza MRN: [id number] DOB/AGE: 40-Sep-1982 40 y.o.  Admit date: 09/08/2018 Discharge date: 09/11/2018   Discharge Diagnoses: Menorrhagia and Symptomatic Uterine Fibroids Active Problems:   Leiomyoma of uterus, unspecified   Fibroid, uterine   Operation: Total Abdominal Hysterectomy with Bilateral Salpingectomy and Cystoscopy   Discharged Condition: Good  Hospital Course: On the date of admission the patient underwent the aforementioned procedures and tolerated them well.  Post operative course was unremarkable with the patient resuming bowel and bladder function by post operative day #1 and  was therefore deemed ready for discharge home.  Discharge hemoglobin was 9.4.  Disposition:   Home to Self Care   Discharge Medications:  Allergies as of 09/11/2018      Reactions   Aspirin Other (See Comments)   Childhood allergy--reaction is unknown      Medication List    STOP taking these medications   norethindrone 5 MG tablet Commonly known as:  AYGESTIN     TAKE these medications   cephALEXin 500 MG capsule Commonly known as:  KEFLEX Take 1 capsule (500 mg total) by mouth 2 (two) times daily.   hydrochlorothiazide 25 MG tablet Commonly known as:  HYDRODIURIL Take 25 mg by mouth at bedtime.   ibuprofen 600 MG tablet Commonly known as:  ADVIL,MOTRIN 1  po  pc every 6 hours for 5 days then as needed for pain.   NIFEdipine 30 MG 24 hr tablet Commonly known as:  ADALAT CC Take 30 mg by mouth every evening.   oxyCODONE-acetaminophen 5-325 MG tablet Commonly known as:  PERCOCET/ROXICET take 1  tablet po every 6 hours prn for breakthrough post operative pain What changed:    how much to take  how to take this  when to take this  reasons to take this  additional instructions   PARoxetine 10 MG tablet Commonly known as:  PAXIL Take 10 mg by mouth every evening.   Vitamin D3 1.25 MG (50000 UT) Caps Take 50,000  Units by mouth every Wednesday.        Follow-up: Dr. Everett Graff on September 14, 2018 at 2:30 pm (wound check) and October 14, 2018 at 9;45 am (6 weeks post operative visit)     Signed: Earnstine Regal, PA-C 09/11/2018, 9:29 AM

## 2019-04-20 ENCOUNTER — Emergency Department (HOSPITAL_COMMUNITY): Payer: BC Managed Care – PPO

## 2019-04-20 ENCOUNTER — Encounter (HOSPITAL_COMMUNITY): Payer: Self-pay | Admitting: Emergency Medicine

## 2019-04-20 ENCOUNTER — Other Ambulatory Visit: Payer: Self-pay

## 2019-04-20 ENCOUNTER — Emergency Department (HOSPITAL_COMMUNITY)
Admission: EM | Admit: 2019-04-20 | Discharge: 2019-04-21 | Disposition: A | Discharge status: Home / Self Care | Payer: BC Managed Care – PPO | Attending: Emergency Medicine | Admitting: Emergency Medicine

## 2019-04-20 DIAGNOSIS — R509 Fever, unspecified: Secondary | ICD-10-CM | POA: Diagnosis not present

## 2019-04-20 DIAGNOSIS — Z79899 Other long term (current) drug therapy: Secondary | ICD-10-CM | POA: Diagnosis not present

## 2019-04-20 DIAGNOSIS — R6883 Chills (without fever): Secondary | ICD-10-CM

## 2019-04-20 DIAGNOSIS — R51 Headache: Secondary | ICD-10-CM | POA: Insufficient documentation

## 2019-04-20 DIAGNOSIS — Z20822 Contact with and (suspected) exposure to covid-19: Secondary | ICD-10-CM

## 2019-04-20 DIAGNOSIS — I10 Essential (primary) hypertension: Secondary | ICD-10-CM | POA: Insufficient documentation

## 2019-04-20 DIAGNOSIS — R059 Cough, unspecified: Secondary | ICD-10-CM

## 2019-04-20 DIAGNOSIS — R05 Cough: Secondary | ICD-10-CM | POA: Diagnosis not present

## 2019-04-20 LAB — URINALYSIS, ROUTINE W REFLEX MICROSCOPIC
Bacteria, UA: NONE SEEN
Bilirubin Urine: NEGATIVE
Glucose, UA: NEGATIVE mg/dL
Ketones, ur: NEGATIVE mg/dL
Leukocytes,Ua: NEGATIVE
Nitrite: NEGATIVE
Protein, ur: NEGATIVE mg/dL
Specific Gravity, Urine: 1.013 (ref 1.005–1.030)
pH: 5 (ref 5.0–8.0)

## 2019-04-20 LAB — LACTIC ACID, PLASMA: Lactic Acid, Venous: 2.1 mmol/L (ref 0.5–1.9)

## 2019-04-20 LAB — CBC WITH DIFFERENTIAL/PLATELET
Abs Immature Granulocytes: 0.01 10*3/uL (ref 0.00–0.07)
Basophils Absolute: 0 10*3/uL (ref 0.0–0.1)
Basophils Relative: 1 %
Eosinophils Absolute: 0 10*3/uL (ref 0.0–0.5)
Eosinophils Relative: 1 %
HCT: 46.8 % — ABNORMAL HIGH (ref 36.0–46.0)
Hemoglobin: 14.6 g/dL (ref 12.0–15.0)
Immature Granulocytes: 0 %
Lymphocytes Relative: 21 %
Lymphs Abs: 0.8 10*3/uL (ref 0.7–4.0)
MCH: 25.1 pg — ABNORMAL LOW (ref 26.0–34.0)
MCHC: 31.2 g/dL (ref 30.0–36.0)
MCV: 80.4 fL (ref 80.0–100.0)
Monocytes Absolute: 0.5 10*3/uL (ref 0.1–1.0)
Monocytes Relative: 13 %
Neutro Abs: 2.5 10*3/uL (ref 1.7–7.7)
Neutrophils Relative %: 64 %
Platelets: 197 10*3/uL (ref 150–400)
RBC: 5.82 MIL/uL — ABNORMAL HIGH (ref 3.87–5.11)
RDW: 15 % (ref 11.5–15.5)
WBC: 3.9 10*3/uL — ABNORMAL LOW (ref 4.0–10.5)
nRBC: 0 % (ref 0.0–0.2)

## 2019-04-20 LAB — I-STAT BETA HCG BLOOD, ED (MC, WL, AP ONLY): I-stat hCG, quantitative: <5 m[IU]/mL (ref <5)

## 2019-04-20 LAB — COMPREHENSIVE METABOLIC PANEL
ALT: 28 U/L (ref 0–44)
AST: 29 U/L (ref 15–41)
Albumin: 3.5 g/dL (ref 3.5–5.0)
Alkaline Phosphatase: 69 U/L (ref 38–126)
Anion gap: 11 (ref 5–15)
BUN: 13 mg/dL (ref 6–20)
CO2: 26 mmol/L (ref 22–32)
Calcium: 8.9 mg/dL (ref 8.9–10.3)
Chloride: 96 mmol/L — ABNORMAL LOW (ref 98–111)
Creatinine, Ser: 1.04 mg/dL — ABNORMAL HIGH (ref 0.44–1.00)
GFR calc Af Amer: >60 mL/min (ref >60)
GFR calc non Af Amer: >60 mL/min (ref >60)
Glucose, Bld: 210 mg/dL — ABNORMAL HIGH (ref 70–99)
Potassium: 2.9 mmol/L — ABNORMAL LOW (ref 3.5–5.1)
Sodium: 133 mmol/L — ABNORMAL LOW (ref 135–145)
Total Bilirubin: 0.1 mg/dL — ABNORMAL LOW (ref 0.3–1.2)
Total Protein: 6.9 g/dL (ref 6.5–8.1)

## 2019-04-20 MED ORDER — OXYCODONE-ACETAMINOPHEN 5-325 MG PO TABS
1.0000 | ORAL_TABLET | ORAL | Status: DC | PRN
  Administered 2019-04-20: 1 via ORAL
  Filled 2019-04-20: qty 1

## 2019-04-20 MED ORDER — SODIUM CHLORIDE 0.9% FLUSH
3.0000 mL | Freq: Once | INTRAVENOUS | Status: AC
  Administered 2019-04-21: 3 mL via INTRAVENOUS

## 2019-04-20 NOTE — ED Triage Notes (Signed)
Pt reports cough, fever and headache for the past few days. States she went to her pcp who did blood work and a covid test, but does not have covid test results yet. Was given abx at pcp.

## 2019-04-21 LAB — LACTIC ACID, PLASMA: Lactic Acid, Venous: 1.9 mmol/L (ref 0.5–1.9)

## 2019-04-21 MED ORDER — PROCHLORPERAZINE EDISYLATE 10 MG/2ML IJ SOLN
10.0000 mg | Freq: Once | INTRAMUSCULAR | Status: AC
  Administered 2019-04-21: 04:00:00 10 mg via INTRAVENOUS
  Filled 2019-04-21: qty 2

## 2019-04-21 MED ORDER — SODIUM CHLORIDE 0.9 % IV BOLUS (SEPSIS)
1000.0000 mL | Freq: Once | INTRAVENOUS | Status: AC
  Administered 2019-04-21: 1000 mL via INTRAVENOUS

## 2019-04-21 MED ORDER — ACETAMINOPHEN 500 MG PO TABS
1000.0000 mg | ORAL_TABLET | Freq: Once | ORAL | Status: AC
  Administered 2019-04-21: 1000 mg via ORAL
  Filled 2019-04-21: qty 2

## 2019-04-21 MED ORDER — METOCLOPRAMIDE HCL 5 MG/ML IJ SOLN
10.0000 mg | Freq: Once | INTRAMUSCULAR | Status: AC
  Administered 2019-04-21: 10 mg via INTRAVENOUS
  Filled 2019-04-21: qty 2

## 2019-04-21 MED ORDER — KETOROLAC TROMETHAMINE 30 MG/ML IJ SOLN
30.0000 mg | Freq: Once | INTRAMUSCULAR | Status: AC
  Administered 2019-04-21: 30 mg via INTRAVENOUS
  Filled 2019-04-21: qty 1

## 2019-04-21 MED ORDER — DIPHENHYDRAMINE HCL 50 MG/ML IJ SOLN
50.0000 mg | Freq: Once | INTRAMUSCULAR | Status: AC
  Administered 2019-04-21: 50 mg via INTRAVENOUS
  Filled 2019-04-21: qty 1

## 2019-04-21 MED ORDER — DEXAMETHASONE SODIUM PHOSPHATE 10 MG/ML IJ SOLN
10.0000 mg | Freq: Once | INTRAMUSCULAR | Status: AC
  Administered 2019-04-21: 10 mg via INTRAVENOUS
  Filled 2019-04-21: qty 1

## 2019-04-21 MED ORDER — POTASSIUM CHLORIDE CRYS ER 20 MEQ PO TBCR
40.0000 meq | EXTENDED_RELEASE_TABLET | Freq: Once | ORAL | Status: AC
  Administered 2019-04-21: 40 meq via ORAL
  Filled 2019-04-21: qty 2

## 2019-04-21 NOTE — ED Notes (Signed)
Pt ambulated hall about 75ft without any assistance maintaining O2 saturations above 97% with HR 112. Still c/o of headache.

## 2019-04-21 NOTE — ED Provider Notes (Signed)
TIME SEEN: 1:14 AM  CHIEF COMPLAINT: Fevers, cough, sinus pressure, ear pain, headache, body aches  HPI: Patient is a 38 year old female with a 4-day history of fevers, chills, body aches, nonproductive cough, left-sided sinus pressure and left ear pain, left-sided headache who presents to the emergency department with worsening headache unrelieved with 800 mg of ibuprofen at home.  She states her headache improves when her fever comes down.  She has not taking any Tylenol.  She denies nausea, vomiting or diarrhea.  No chest pain or shortness of breath.  No neck pain or neck stiffness.  No photophobia.  Headache COVID swab on September 21 which is pending.  States she should get her results later today.  States her primary care doctor started her on azithromycin.  ROS: See HPI Constitutional:  fever  Eyes: no drainage  ENT: no runny nose   Cardiovascular:  no chest pain  Resp: no SOB  GI: no vomiting GU: no dysuria Integumentary: no rash  Allergy: no hives  Musculoskeletal: no leg swelling  Neurological: no slurred speech ROS otherwise negative  PAST MEDICAL HISTORY/PAST SURGICAL HISTORY:  Past Medical History:  Diagnosis Date  . Anemia    low iron  . Anxiety   . History of kidney stones   . Hypertension     MEDICATIONS:  Prior to Admission medications   Medication Sig Start Date End Date Taking? Authorizing Provider  cephALEXin (KEFLEX) 500 MG capsule Take 1 capsule (500 mg total) by mouth 2 (two) times daily. 09/11/18   Earnstine Regal, PA-C  Cholecalciferol (VITAMIN D3) 1.25 MG (50000 UT) CAPS Take 50,000 Units by mouth every Wednesday. 08/11/18   [provider]  hydrochlorothiazide (HYDRODIURIL) 25 MG tablet Take 25 mg by mouth at bedtime.     [provider]  ibuprofen (ADVIL,MOTRIN) 600 MG tablet 1  po  pc every 6 hours for 5 days then as needed for pain. 09/11/18   Earnstine Regal, PA-C  NIFEdipine (ADALAT CC) 30 MG 24 hr tablet Take 30 mg by mouth every  evening. 08/13/18   [provider]  oxyCODONE-acetaminophen (PERCOCET/ROXICET) 5-325 MG tablet take 1  tablet po every 6 hours prn for breakthrough post operative pain 09/11/18   Earnstine Regal, PA-C  PARoxetine (PAXIL) 10 MG tablet Take 10 mg by mouth every evening.  08/30/15   [provider]    ALLERGIES:  Allergies  Allergen Reactions  . Aspirin Other (See Comments)    Childhood allergy--reaction is unknown    SOCIAL HISTORY:  Social History   Tobacco Use  . Smoking status: Never Smoker  . Smokeless tobacco: Never Used  Substance Use Topics  . Alcohol use: No    FAMILY HISTORY: No family history on file.  EXAM: BP (!) 146/83 (BP Location: Left Arm)   Pulse (!) 117   Temp 99.7 F (37.6 C) (Oral)   Resp 20   SpO2 97%  CONSTITUTIONAL: Alert and oriented and responds appropriately to questions. Well-appearing; well-nourished, obese, nontoxic-appearing HEAD: Normocephalic, atraumatic EYES: Conjunctivae clear, pupils appear equal, EOMI ENT: normal nose; moist mucous membranes; No pharyngeal erythema or petechiae, no tonsillar hypertrophy or exudate, no uvular deviation, no unilateral swelling, no trismus or drooling, no muffled voice, normal phonation, no stridor, no dental caries present, no drainable dental abscess noted, no Ludwig's angina, tongue sits flat in the bottom of the mouth, no angioedema, no facial erythema or warmth, no facial swelling; no pain with movement of the neck.  TMs are clear bilaterally  without erythema, purulence, bulging, perforation, effusion.  No cerumen impaction or sign of foreign body in the external auditory canal. No inflammation, erythema or drainage from the external auditory canal. No signs of mastoiditis. No pain with manipulation of the pinna bilaterally.  Patient is tender to palpation over the left maxillary and frontal sinus without redness, warmth, swelling. NECK: Supple, no meningismus, no nuchal rigidity, no LAD  CARD:  Regular rate and rhythm, heart rate in the 90s on my evaluation; S1 and S2 appreciated; no murmurs, no clicks, no rubs, no gallops RESP: Normal chest excursion without splinting or tachypnea; breath sounds clear and equal bilaterally; no wheezes, no rhonchi, no rales, no hypoxia or respiratory distress, speaking full sentences ABD/GI: Normal bowel sounds; non-distended; soft, non-tender, no rebound, no guarding, no peritoneal signs, no hepatosplenomegaly BACK:  The back appears normal and is non-tender to palpation, there is no CVA tenderness EXT: Normal ROM in all joints; non-tender to palpation; no edema; normal capillary refill; no cyanosis, no calf tenderness or swelling    SKIN: Normal color for age and race; warm; no rash NEURO: Moves all extremities equally, normal speech, no facial asymmetry PSYCH: The patient's mood and manner are appropriate. Grooming and personal hygiene are appropriate.  MEDICAL DECISION MAKING: Patient here with symptoms of a viral syndrome.  Could be possible COVID.  Labs unremarkable other than potassium of 2.9 without EKG changes.  Will give oral potassium replacement.  Lactate mildly elevated but doubt sepsis.  Heart rate currently in the 90s.  Chest x-ray clear.  She did have one slightly low oxygen saturation of 91% on room air but currently is satting 100% without increased work of breathing or respiratory distress.  She was sent for COVID on the 21st and her test results should come back today.  I do not feel she needs repeat COVID swabbing from the emergency department as it would not change our management of her today.  ED PROGRESS: Patient's repeat lactate is normal at 1.9.  Her sats are 97% with ambulation.  Reports some improvement in headache after migraine cocktail but still complaining of discomfort and pressure.  Low suspicion for meningitis.  She has no photophobia, neck pain or neck stiffness.  No meningismus on exam.  Will give Decadron, Compazine and  reassess.  I suspect that her headache is related to sinus pressure.   4:30 AM  Pt reports headache improved but not gone.  She states she is feeling hot and wants to leave.  Discussed with patient that this is likely her fever dropping causing her to feel warm.  Have offered her further medications for her headache but she states that she would like to leave.  I suspect patient has a viral syndrome and likely COVID and will need to quarantine for at least 10 days and be fever free for at least 3 full days.  She states she was provided a work note by her primary doctor.  Have advised her to complete the course of azithromycin that she was prescribed by her PCP.  There are no signs of bacterial infection today.  We discussed at length that I do not feel this is meningitis and I do not feel she needs a spinal tap and she agrees with this plan.  This seems to be more sinus pressure.  Recommended alternating Tylenol, Motrin, increasing rest and fluid intake at home.  At this time, I do not feel there is any life-threatening condition present. I have reviewed and discussed  all results (EKG, imaging, lab, urine as appropriate) and exam findings with patient/family. I have reviewed nursing notes and appropriate previous records.  I feel the patient is safe to be discharged home without further emergent workup and can continue workup as an outpatient as needed. Discussed usual and customary return precautions. Patient/family verbalize understanding and are comfortable with this plan.  Outpatient follow-up has been provided as needed. All questions have been answered.   EKG Interpretation  Date/Time:  Wednesday April 21 2019 00:47:39 EDT Ventricular Rate:  95 PR Interval:    QRS Duration: 93 QT Interval:  350 QTC Calculation: 440 R Axis:   17 Text Interpretation:  Sinus rhythm Low voltage, precordial leads Abnormal R-wave progression, early transition No significant change since last tracing Confirmed by  Pryor Curia [phone]) on 04/21/2019 12:49:23 AM       [name] was evaluated in Emergency Department on 04/21/2019 for the symptoms described in the history of present illness. She was evaluated in the context of the global COVID-19 pandemic, which necessitated consideration that the patient might be at risk for infection with the SARS-CoV-2 virus that causes COVID-19. Institutional protocols and algorithms that pertain to the evaluation of patients at risk for COVID-19 are in a state of rapid change based on information released by regulatory bodies including the CDC and federal and state organizations. These policies and algorithms were followed during the patient's care in the ED.     Kisha Messman, Delice Bison, DO 04/21/19 [phone]

## 2019-04-21 NOTE — Discharge Instructions (Signed)
You may alternate Tylenol 1000 mg every 6 hours as needed for pain and fever and Ibuprofen 800 mg every 8 hours as needed for pain and fever.  Please take Ibuprofen with food.  You may use over-the-counter Robitussin and Mucinex as needed for cough.  You may use over-the-counter nasal saline, Flonase to help with sinus pressure.  You will need to quarantine for at least 10 days after the onset of symptoms even if your COVID test is negative.  I recommend that you continue to quarantine until you have been fever free without using Tylenol or ibuprofen for 3 full days.

## 2019-10-04 ENCOUNTER — Encounter: Payer: Self-pay | Admitting: Allergy and Immunology

## 2019-10-04 ENCOUNTER — Other Ambulatory Visit: Payer: Self-pay

## 2019-10-04 ENCOUNTER — Ambulatory Visit: Payer: BC Managed Care – PPO | Admitting: Allergy and Immunology

## 2019-10-04 VITALS — BP 124/84 | HR 78 | Temp 97.5°F | Resp 18 | Ht 65.0 in | Wt 306.8 lb

## 2019-10-04 DIAGNOSIS — R05 Cough: Secondary | ICD-10-CM | POA: Diagnosis not present

## 2019-10-04 DIAGNOSIS — T781XXD Other adverse food reactions, not elsewhere classified, subsequent encounter: Secondary | ICD-10-CM

## 2019-10-04 DIAGNOSIS — H1013 Acute atopic conjunctivitis, bilateral: Secondary | ICD-10-CM

## 2019-10-04 DIAGNOSIS — R059 Cough, unspecified: Secondary | ICD-10-CM | POA: Insufficient documentation

## 2019-10-04 DIAGNOSIS — J3089 Other allergic rhinitis: Secondary | ICD-10-CM

## 2019-10-04 DIAGNOSIS — R0609 Other forms of dyspnea: Secondary | ICD-10-CM | POA: Diagnosis not present

## 2019-10-04 DIAGNOSIS — T781XXA Other adverse food reactions, not elsewhere classified, initial encounter: Secondary | ICD-10-CM | POA: Insufficient documentation

## 2019-10-04 DIAGNOSIS — H1010 Acute atopic conjunctivitis, unspecified eye: Secondary | ICD-10-CM | POA: Insufficient documentation

## 2019-10-04 MED ORDER — LEVOCETIRIZINE DIHYDROCHLORIDE 5 MG PO TABS: 5.0000 mg | ORAL_TABLET | Freq: Every evening | ORAL | 5 refills | Status: DC

## 2019-10-04 MED ORDER — XHANCE 93 MCG/ACT NA EXHU: 2.0000 | INHALANT_SUSPENSION | Freq: Two times a day (BID) | NASAL | 2 refills | Status: DC

## 2019-10-04 MED ORDER — OLOPATADINE HCL 0.2 % OP SOLN: 1.0000 [drp] | OPHTHALMIC | 5 refills | Status: AC

## 2019-10-04 NOTE — Patient Instructions (Addendum)
Perennial and seasonal allergic rhinitis  Aeroallergen avoidance measures have been discussed and provided in written form.  A prescription has been provided for levocetirizine(Xyzal), 5 mg daily as needed.  A prescription has been provided for [facility], 2 actuations per nostril twice a day. Proper technique has been discussed and demonstrated.  Nasal saline spray (i.e., Simply Saline) or nasal saline lavage (i.e., NeilMed) is recommended as needed and prior to medicated nasal sprays.  The risks and benefits of aeroallergen immunotherapy have been discussed. The patient is motivated to initiate immunotherapy if insurance coverage is favorable. She will let us know how she would like to proceed.  Allergic conjunctivitis  Treatment plan as outlined above for allergic rhinitis.  A prescription has been provided for olopatadine 0.7%, one drop per eye daily as needed.  I have also recommended eye lubricant drops (i.e., Natural Tears) as needed.  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 Peeling or cooking the food has shown to reduce symptoms and antihistamines may also relieve symptoms. Immunotherapy to the cross reacting pollens has improved or cured OAS in many patients, though this has not been consistent for all patients. Typically OAS is limited to itching or swelling of mucosal tissues from the lips to the back of the throat.   Information about OAS has been discussed and provided in written form.  All foods causing symptoms are to be avoided.  Should symptoms progress beyond the mouth and throat, 911 is to be called immediately.  Coughing  The patient's history suggests upper airway cough syndrome.  Spirometry today reveals normal ventilatory function. We will aggressively treat postnasal drainage and evaluate results.  Treatment plan as outlined above.  For thick post nasal drainage, add guaifenesin  1200 mg (Mucinex Maximum Strength)  twice daily as needed with adequate hydration as discussed.  If the coughing persists or progresses despite this plan, we will evaluate further.   Return in about 3 months (around 01/04/2020), or if symptoms worsen or fail to improve.  Control of Dust Mite Allergen  House dust mites play a major role in allergic asthma and rhinitis.  They occur in environments with high humidity wherever human skin, the food for dust mites is found. High levels have been detected in dust obtained from mattresses, pillows, carpets, upholstered furniture, bed covers, clothes and soft toys.  The principal allergen of the house dust mite is found in its feces.  A gram of dust may contain 1,000 mites and 250,000 fecal particles.  Mite antigen is easily measured in the air during house cleaning activities.    1. Encase mattresses, including the box spring, and pillow, in an air tight cover.  Seal the zipper end of the encased mattresses with wide adhesive tape. 2. Wash the bedding in water of 130 degrees Farenheit weekly.  Avoid cotton comforters/quilts and flannel bedding: the most ideal bed covering is the dacron comforter. 3. Remove all upholstered furniture from the bedroom. 4. Remove carpets, carpet padding, rugs, and non-washable window drapes from the bedroom.  Wash drapes weekly or use plastic window coverings. 5. Remove all non-washable stuffed toys from the bedroom.  Wash stuffed toys weekly. 6. Have the room cleaned frequently with a vacuum cleaner and a damp dust-mop.  The patient should not be in a room which is being cleaned and should wait 1 hour after cleaning before going into the room. 7. Close and seal all heating outlets in the bedroom.  Otherwise, the room will become filled with dust-laden air.  An electric heater can be used to heat the room. Reduce indoor humidity to less than 50%.  Do not use a humidifier.   Reducing Pollen Exposure  The American Academy of  Allergy, Asthma and Immunology suggests the following steps to reduce your exposure to pollen during allergy seasons.    1. Do not hang sheets or clothing out to dry; pollen may collect on these items. 2. Do not mow lawns or spend time around freshly cut grass; mowing stirs up pollen. 3. Keep windows closed at night.  Keep car windows closed while driving. 4. Minimize morning activities outdoors, a time when pollen counts are usually at their highest. 5. Stay indoors as much as possible when pollen counts or humidity is high and on windy days when pollen tends to remain in the air longer. 6. Use air conditioning when possible.  Many air conditioners have filters that trap the pollen spores. 7. Use a HEPA room air filter to remove pollen form the indoor air you breathe.   Control of Dog or Cat Allergen  Avoidance is the best way to manage a dog or cat allergy. If you have a dog or cat and are allergic to dog or cats, consider removing the dog or cat from the home. If you have a dog or cat but don't want to find it a new home, or if your family wants a pet even though someone in the household is allergic, here are some strategies that may help keep symptoms at bay:  1. Keep the pet out of your bedroom and restrict it to only a few rooms. Be advised that keeping the dog or cat in only one room will not limit the allergens to that room. 2. Don't pet, hug or kiss the dog or cat; if you do, wash your hands with soap and water. 3. High-efficiency particulate air (HEPA) cleaners run continuously in a bedroom or living room can reduce allergen levels over time. 4. Place electrostatic material sheet in the air inlet vent in the bedroom. 5. Regular use of a high-efficiency vacuum cleaner or a central vacuum can reduce allergen levels. 6. Giving your dog or cat a bath at least once a week can reduce airborne allergen.   Control of Mold Allergen  Mold and fungi can grow on a variety of surfaces provided  certain temperature and moisture conditions exist.  Outdoor molds grow on plants, decaying vegetation and soil.  The major outdoor mold, Alternaria and Cladosporium, are found in very high numbers during hot and dry conditions.  Generally, a late Summer - Fall peak is seen for common outdoor fungal spores.  Rain will temporarily lower outdoor mold spore count, but counts rise rapidly when the rainy period ends.  The most important indoor molds are Aspergillus and Penicillium.  Dark, humid and poorly ventilated basements are ideal sites for mold growth.  The next most common sites of mold growth are the bathroom and the kitchen.  Outdoor [employer] 1. Use air conditioning and keep windows closed 2. Avoid exposure to decaying vegetation. 3. Avoid leaf raking. 4. Avoid grain handling. 5. Consider wearing a face mask if working in moldy areas.  Indoor Mold Control 1. Maintain humidity below 50%. 2. Clean washable surfaces with 5% bleach solution. 3. Remove sources e.g. Contaminated carpets.

## 2019-10-04 NOTE — Progress Notes (Signed)
New Patient Note  RE: Deborah Garza MRN: [id number] DOB: 06/12/1981 Date of Office Visit: 10/04/2019  Referring provider: No ref. provider found Primary care provider: Iona Beard, MD  Chief Complaint: Allergic Rhinitis , Sinus Problem, and Cough   History of present illness: Deborah Garza is a 39 y.o. female presenting today for evaluation of coughing and allergic rhinitis.  She complains of nasal congestion, rhinorrhea, sneezing, postnasal drainage, nasal pruritus, ocular pruritus, ear canal pruritus, and sinus pressure over the forehead.  The symptoms occur year-round but are more frequent and severe with pollen exposure.  She also experiences nasal pruritus, and postnasal drainage in the presence of cigarette smoke as well as certain strong aromas such as detergents and chemicals. She also notes that since August 2020 she has had episodes of coughing.  The cough is described as hacking and productive of clear or white mucus.  She has taken [employer] with benefit.  She denies chest tightness and wheezing, however does complain of shortness of breath with exertion, particularly while wearing her mask.  She was started on ranitidine in the past and the cough resolved, however then ranitidine was taken off the market and her cough returned.  She denies overt heartburn.  She has no history of childhood asthma or family history of asthma.  She has been diagnosed with bronchitis on 2 or more occasions.  She has 2 sinus infections per year on average requiring antibiotics and/or systemic steroids. She is interested in being skin tested in apple because as a child/teenager she experienced oropharyngeal pruritus with the consumption of raw/fresh apples.  She does not recall having tried cooked apples.  Assessment and plan: Perennial and seasonal allergic rhinitis  Aeroallergen avoidance measures have been discussed and provided in written form.  A prescription has been provided for  levocetirizine(Xyzal), 5 mg daily as needed.  A prescription has been provided for [facility], 2 actuations per nostril twice a day. Proper technique has been discussed and demonstrated.  Nasal saline spray (i.e., Simply Saline) or nasal saline lavage (i.e., NeilMed) is recommended as needed and prior to medicated nasal sprays.  The risks and benefits of aeroallergen immunotherapy have been discussed. The patient is motivated to initiate immunotherapy if insurance coverage is favorable. She will let us know how she would like to proceed.  Allergic conjunctivitis  Treatment plan as outlined above for allergic rhinitis.  A prescription has been provided for olopatadine 0.7%, one drop per eye daily as needed.  I have also recommended eye lubricant drops (i.e., Natural Tears) as needed.  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 Peeling or cooking the food has shown to reduce symptoms and antihistamines may also relieve symptoms. Immunotherapy to the cross reacting pollens has improved or cured OAS in many patients, though this has not been consistent for all patients. Typically OAS is limited to itching or swelling of mucosal tissues from the lips to the back of the throat.   Information about OAS has been discussed and provided in written form.  All foods causing symptoms are to be avoided.  Should symptoms progress beyond the mouth and throat, 911 is to be called immediately.  Coughing  The patient's history suggests upper airway cough syndrome.  Spirometry today reveals normal ventilatory function. We will aggressively treat postnasal drainage and evaluate results.  Treatment plan as outlined above.  For thick post nasal drainage, add guaifenesin 1200 mg (Mucinex Maximum  Strength)  twice daily as needed with adequate hydration as discussed.  If the coughing persists or progresses despite this plan, we will  evaluate further.   Meds ordered this encounter  Medications  . levocetirizine (XYZAL) 5 MG tablet    Sig: Take 1 tablet (5 mg total) by mouth every evening.    Dispense:  30 tablet    Refill:  5  . Fluticasone Propionate (XHANCE) 93 MCG/ACT EXHU    Sig: Place 2 sprays into the nose in the morning and at bedtime.    Dispense:  32 mL    Refill:  2  . Olopatadine HCl (PATADAY) 0.2 % SOLN    Sig: Place 1 drop into both eyes 1 day or 1 dose.    Dispense:  2.5 mL    Refill:  5    Diagnostics: Spirometry: Normal with an FEV1 of 114% predicted. This study was performed while the patient was asymptomatic.  Please see scanned spirometry results for details. Epicutaneous testing: Positive to grass pollen, weed pollen, ragweed pollen, tree pollen, molds, cat hair, and dust mite antigen. Intradermal testing: Major mold mix #1, major mold mix #4, and dog epithelia.   Physical examination: Blood pressure 124/84, pulse 78, temperature (!) 97.5 F (36.4 C), temperature source Temporal, resp. rate 18, height 5\' 5"  (1.651 m), weight (!) 306 lb 12.8 oz (139.2 kg), SpO2 98 %.  General: Alert, interactive, in no acute distress. HEENT: TMs pearly gray, turbinates moderately edematous with clear discharge, post-pharynx mildly erythematous. Neck: Supple without lymphadenopathy. Lungs: Clear to auscultation without wheezing, rhonchi or rales. CV: Normal S1, S2 without murmurs. Abdomen: Nondistended, nontender. Skin: Warm and dry, without lesions or rashes. Extremities:  No clubbing, cyanosis or edema. Neuro:   Grossly intact.  Review of systems:  Review of systems negative except as noted in HPI / PMHx or noted below: Review of Systems  Constitutional: Negative.   HENT: Negative.   Eyes: Negative.   Respiratory: Negative.   Cardiovascular: Negative.   Gastrointestinal: Negative.   Genitourinary: Negative.   Musculoskeletal: Negative.   Skin: Negative.   Neurological: Negative.     Endo/Heme/Allergies: Negative.   Psychiatric/Behavioral: Negative.     Past medical history:  Past Medical History:  Diagnosis Date  . Anemia    low iron  . Anxiety   . Eczema   . History of kidney stones   . Hypertension     Past surgical history:  Past Surgical History:  Procedure Laterality Date  . ABDOMINAL HYSTERECTOMY Bilateral 09/08/2018   Procedure: HYSTERECTOMY ABDOMINAL with Bilateral Salpingectomy;  Surgeon: Everett Graff, MD;  Location: [facility];  Service: Gynecology;  Laterality: Bilateral;  . CESAREAN SECTION    . CYSTOSCOPY  09/08/2018   Procedure: CYSTOSCOPY;  Surgeon: Everett Graff, MD;  Location: [facility];  Service: Gynecology;;  . Murrell Redden & CURRETTAGE/HYSTROSCOPY WITH HYDROTHERMAL ABLATION N/A 08/28/2018   Procedure: DILATATION & CURETTAGE/HYSTEROSCOPY, Attempted Hydrothermal Ablation, Attempted Novasure;  Surgeon: Everett Graff, MD;  Location: [facility];  Service: Gynecology;  Laterality: N/A;  . ear tubes    . LAPAROSCOPIC TUBAL LIGATION Bilateral 08/28/2018   Procedure: Attempted LAPAROSCOPIC TUBAL LIGATION;  Surgeon: Everett Graff, MD;  Location: [facility];  Service: Gynecology;  Laterality: Bilateral;  . TONSILLECTOMY      Family history: History reviewed. No pertinent family history.  Social history: Social History   Socioeconomic History  . Marital status: Single    Spouse name: Not on file  . Number of children: Not on  file  . Years of education: Not on file  . Highest education level: Not on file  Occupational History  . Not on file  Tobacco Use  . Smoking status: Never Smoker  . Smokeless tobacco: Never Used  Substance and Sexual Activity  . Alcohol use: No  . Drug use: No  . Sexual activity: Yes    Birth control/protection: I.U.D.  Other Topics Concern  . Not on file  Social History Narrative  . Not on file   Social Determinants of Health   Financial Resource Strain:   . Difficulty of Paying Living Expenses: Not on file  Food  Insecurity:   . Worried About Charity fundraiser in the Last Year: Not on file  . Ran Out of Food in the Last Year: Not on file  Transportation Needs:   . Lack of Transportation (Medical): Not on file  . Lack of Transportation (Non-Medical): Not on file  Physical Activity:   . Days of Exercise per Week: Not on file  . Minutes of Exercise per Session: Not on file  Stress:   . Feeling of Stress : Not on file  Social Connections:   . Frequency of Communication with Friends and Family: Not on file  . Frequency of Social Gatherings with Friends and Family: Not on file  . Attends Religious Services: Not on file  . Active Member of Clubs or Organizations: Not on file  . Attends Archivist Meetings: Not on file  . Marital Status: Not on file  Intimate Partner Violence:   . Fear of Current or Ex-Partner: Not on file  . Emotionally Abused: Not on file  . Physically Abused: Not on file  . Sexually Abused: Not on file    Environmental History: The patient lives in a 41 year old house with carpeting throughout, gassy, and central air.  There is a dog in the home which does not have access to her bedroom.  There is mold/water damage in the home.  She is a non-smoker.  Current Outpatient Medications  Medication Sig Dispense Refill  . acetaminophen (TYLENOL) 325 MG tablet Take 650 mg by mouth every 6 (six) hours as needed for mild pain.    Marland Kitchen atorvastatin (LIPITOR) 10 MG tablet Take 10 mg by mouth at bedtime.    . fluticasone (FLONASE) 50 MCG/ACT nasal spray Place 1 spray into both nostrils daily.    . hydrochlorothiazide (HYDRODIURIL) 25 MG tablet Take 25 mg by mouth at bedtime.     Marland Kitchen ibuprofen (ADVIL) 800 MG tablet Take 800 mg by mouth 2 (two) times daily.    . metFORMIN (GLUCOPHAGE-XR) 500 MG 24 hr tablet Take 500 mg by mouth every evening.    Marland Kitchen NIFEdipine (ADALAT CC) 30 MG 24 hr tablet Take 30 mg by mouth every evening.    Renda Rolls AD 4-10-325 MG TABS Take 1 tablet by mouth 2 (two)  times daily.    Marland Kitchen PARoxetine (PAXIL) 10 MG tablet Take 10 mg by mouth every evening.   1  . Fluticasone Propionate (XHANCE) 93 MCG/ACT EXHU Place 2 sprays into the nose in the morning and at bedtime. 32 mL 2  . levocetirizine (XYZAL) 5 MG tablet Take 1 tablet (5 mg total) by mouth every evening. 30 tablet 5  . Olopatadine HCl (PATADAY) 0.2 % SOLN Place 1 drop into both eyes 1 day or 1 dose. 2.5 mL 5   No current facility-administered medications for this visit.    Known medication allergies: Allergies  Allergen Reactions  . Aspirin Other (See Comments)    Childhood allergy--reaction is unknown    I appreciate the opportunity to take part in Gursimran's care. Please do not hesitate to contact me with questions.  Sincerely,   R. Edgar Frisk, MD

## 2019-10-04 NOTE — Assessment & Plan Note (Addendum)
   Aeroallergen avoidance measures have been discussed and provided in written form.  A prescription has been provided for levocetirizine(Xyzal), 5 mg daily as needed.  A prescription has been provided for [facility], 2 actuations per nostril twice a day. Proper technique has been discussed and demonstrated.  Nasal saline spray (i.e., Simply Saline) or nasal saline lavage (i.e., NeilMed) is recommended as needed and prior to medicated nasal sprays.  The risks and benefits of aeroallergen immunotherapy have been discussed. The patient is motivated to initiate immunotherapy if insurance coverage is favorable. She will let us know how she would like to proceed.

## 2019-10-04 NOTE — Assessment & Plan Note (Signed)
   The patient's history suggests upper airway cough syndrome.  Spirometry today reveals normal ventilatory function. We will aggressively treat postnasal drainage and evaluate results.  Treatment plan as outlined above.  For thick post nasal drainage, add guaifenesin 1200 mg (Mucinex Maximum Strength)  twice daily as needed with adequate hydration as discussed.  If the coughing persists or progresses despite this plan, we will evaluate further.

## 2019-10-04 NOTE — Assessment & Plan Note (Signed)
   Treatment plan as outlined above for allergic rhinitis.  A prescription has been provided for olopatadine 0.7%, one drop per eye daily as needed.  I have also recommended eye lubricant drops (i.e., Natural Tears) as needed.

## 2019-10-04 NOTE — Assessment & Plan Note (Signed)
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 Peeling or cooking the food has shown to reduce symptoms and antihistamines may also relieve symptoms. Immunotherapy to the cross reacting pollens has improved or cured OAS in many patients, though this has not been consistent for all patients. Typically OAS is limited to itching or swelling of mucosal tissues from the lips to the back of the throat.   Information about OAS has been discussed and provided in written form.  All foods causing symptoms are to be avoided.  Should symptoms progress beyond the mouth and throat, 911 is to be called immediately.

## 2019-10-06 NOTE — Progress Notes (Signed)
VIALS EXP 10-05-20

## 2019-10-07 DIAGNOSIS — J301 Allergic rhinitis due to pollen: Secondary | ICD-10-CM | POA: Diagnosis not present

## 2019-10-11 DIAGNOSIS — J3089 Other allergic rhinitis: Secondary | ICD-10-CM | POA: Diagnosis not present

## 2019-11-01 ENCOUNTER — Other Ambulatory Visit: Payer: Self-pay

## 2019-11-01 ENCOUNTER — Ambulatory Visit (INDEPENDENT_AMBULATORY_CARE_PROVIDER_SITE_OTHER): Payer: BC Managed Care – PPO

## 2019-11-01 DIAGNOSIS — J309 Allergic rhinitis, unspecified: Secondary | ICD-10-CM

## 2019-11-01 MED ORDER — EPINEPHRINE 0.3 MG/0.3ML IJ SOAJ: 0.3000 mg | Freq: Once | INTRAMUSCULAR | 1 refills | Status: AC | PRN

## 2019-11-01 NOTE — Progress Notes (Signed)
Immunotherapy   Patient Details  Name: Deborah Garza MRN: [id number] Date of Birth: Aug 07, 1980  11/01/2019  [name] started allergy injections today. Patient received 0.05 out of both her silver vials with exp of 10/05/2020. One with G-Weeds-T and the other with M-DM-C-D. Patient waited 30 minutes in an exam room with no problems. Following schedule:  A Frequency: 1-2 times weekly with 72 hours in between. Epi-Pen: Epi pen has been sent in. Consent signed and patient instructions given.   Herbie Drape 11/01/2019, 3:08 PM

## 2019-11-08 ENCOUNTER — Ambulatory Visit (INDEPENDENT_AMBULATORY_CARE_PROVIDER_SITE_OTHER): Payer: BC Managed Care – PPO | Admitting: *Deleted

## 2019-11-08 DIAGNOSIS — J309 Allergic rhinitis, unspecified: Secondary | ICD-10-CM

## 2019-11-19 ENCOUNTER — Ambulatory Visit (INDEPENDENT_AMBULATORY_CARE_PROVIDER_SITE_OTHER): Payer: BC Managed Care – PPO

## 2019-11-19 DIAGNOSIS — J309 Allergic rhinitis, unspecified: Secondary | ICD-10-CM

## 2019-11-23 ENCOUNTER — Ambulatory Visit (INDEPENDENT_AMBULATORY_CARE_PROVIDER_SITE_OTHER): Payer: BC Managed Care – PPO

## 2019-11-23 DIAGNOSIS — J309 Allergic rhinitis, unspecified: Secondary | ICD-10-CM

## 2019-12-01 ENCOUNTER — Ambulatory Visit (INDEPENDENT_AMBULATORY_CARE_PROVIDER_SITE_OTHER): Payer: BC Managed Care – PPO | Admitting: *Deleted

## 2019-12-01 DIAGNOSIS — J309 Allergic rhinitis, unspecified: Secondary | ICD-10-CM

## 2019-12-08 ENCOUNTER — Ambulatory Visit (INDEPENDENT_AMBULATORY_CARE_PROVIDER_SITE_OTHER): Payer: BC Managed Care – PPO

## 2019-12-08 DIAGNOSIS — J309 Allergic rhinitis, unspecified: Secondary | ICD-10-CM

## 2019-12-15 ENCOUNTER — Ambulatory Visit (INDEPENDENT_AMBULATORY_CARE_PROVIDER_SITE_OTHER): Payer: BC Managed Care – PPO | Admitting: *Deleted

## 2019-12-15 DIAGNOSIS — J309 Allergic rhinitis, unspecified: Secondary | ICD-10-CM | POA: Diagnosis not present

## 2019-12-22 ENCOUNTER — Ambulatory Visit (INDEPENDENT_AMBULATORY_CARE_PROVIDER_SITE_OTHER): Payer: BC Managed Care – PPO

## 2019-12-22 DIAGNOSIS — J309 Allergic rhinitis, unspecified: Secondary | ICD-10-CM | POA: Diagnosis not present

## 2019-12-29 ENCOUNTER — Ambulatory Visit (INDEPENDENT_AMBULATORY_CARE_PROVIDER_SITE_OTHER): Payer: BC Managed Care – PPO

## 2019-12-29 DIAGNOSIS — J309 Allergic rhinitis, unspecified: Secondary | ICD-10-CM

## 2020-01-03 ENCOUNTER — Ambulatory Visit (INDEPENDENT_AMBULATORY_CARE_PROVIDER_SITE_OTHER): Payer: BC Managed Care – PPO | Admitting: Allergy and Immunology

## 2020-01-03 ENCOUNTER — Other Ambulatory Visit: Payer: Self-pay

## 2020-01-03 ENCOUNTER — Encounter: Payer: Self-pay | Admitting: Allergy and Immunology

## 2020-01-03 VITALS — BP 126/82 | HR 86 | Temp 98.3°F | Resp 16

## 2020-01-03 DIAGNOSIS — H1013 Acute atopic conjunctivitis, bilateral: Secondary | ICD-10-CM

## 2020-01-03 DIAGNOSIS — T781XXD Other adverse food reactions, not elsewhere classified, subsequent encounter: Secondary | ICD-10-CM

## 2020-01-03 DIAGNOSIS — J3089 Other allergic rhinitis: Secondary | ICD-10-CM

## 2020-01-03 DIAGNOSIS — R05 Cough: Secondary | ICD-10-CM

## 2020-01-03 DIAGNOSIS — R059 Cough, unspecified: Secondary | ICD-10-CM

## 2020-01-03 MED ORDER — XHANCE 93 MCG/ACT NA EXHU: 2.0000 | INHALANT_SUSPENSION | Freq: Two times a day (BID) | NASAL | 2 refills | Status: AC | PRN

## 2020-01-03 NOTE — Progress Notes (Signed)
Follow-up Note  RE: Deborah Garza MRN: [id number] DOB: [birth year] Date of Office Visit: 01/03/2020  Primary care provider: Iona Beard, MD Referring provider: Iona Beard, MD  History of present illness: Deborah Garza is a 39 y.o. female with allergic rhinitis, food allergy, and history of persistent cough presenting today for follow-up.  She was previously seen in this clinic for her initial evaluation on September 29, 2019.  She reports that in the interval since her previous visit her cough improved significantly.  However, over the past 2 weeks she has begun to experience occasional, less severe, episodes of coughing.  She believes that the coughing was triggered by exposure to secondhand cigarette smoke.  The cough is described as an irritation/tickle in the base of her throat.  She denies chest tightness, wheezing, and dyspnea.  Her nasal allergy symptoms have improved overall with Xhance and levocetirizine.  She is receiving immunotherapy buildup injections without problems or complications.  She avoids apples and other fruits which cause oropharyngeal pruritus.  Assessment and plan: Perennial and seasonal allergic rhinitis Stable.  Continue appropriate allergen avoidance measures and immunotherapy injections per protocol.  Continue levocetirizine 5 mg daily if needed.  Continue Xhance, 2 sprays per nostril twice daily if needed.  Medications will be decreased or discontinued as symptom relief from immunotherapy becomes evident.  Allergic conjunctivitis  Continue olopatadine eyedrops as needed and eye lubricant drops if needed.  Pollen-food allergy syndrome  Continue avoidance of all foods causing untoward symptoms.  Should symptoms become concerning for anaphylaxis, epinephrine is to be administered and 911 is to be called immediately.  Coughing  The patient's history suggests upper airway cough syndrome.    Treatment plan as outlined above.  Nasal saline lavage  (NeilMed) has been recommended as needed and prior to medicated nasal sprays along with instructions for proper administration.  For thick post nasal drainage, add guaifenesin 1200 mg (Mucinex Maximum Strength)  twice daily as needed with adequate hydration as discussed.   Meds ordered this encounter  Medications  . Fluticasone Propionate (XHANCE) 93 MCG/ACT EXHU    Sig: Place 2 sprays into the nose 2 (two) times daily as needed.    Dispense:  32 mL    Refill:  2    Physical examination: Blood pressure 126/82, pulse 86, temperature 98.3 F (36.8 C), temperature source Temporal, resp. rate 16, SpO2 97 %.  General: Alert, interactive, in no acute distress. HEENT: TMs pearly gray, turbinates moderately edematous with thick discharge, post-pharynx moderately erythematous. Neck: Supple without lymphadenopathy. Lungs: Clear to auscultation without wheezing, rhonchi or rales. CV: Normal S1, S2 without murmurs. Skin: Warm and dry, without lesions or rashes.  The following portions of the patient's history were reviewed and updated as appropriate: allergies, current medications, past family history, past medical history, past social history, past surgical history and problem list.  Current Outpatient Medications  Medication Sig Dispense Refill  . acetaminophen (TYLENOL) 325 MG tablet Take 650 mg by mouth every 6 (six) hours as needed for mild pain.    Marland Kitchen atorvastatin (LIPITOR) 10 MG tablet Take 10 mg by mouth at bedtime.    Marland Kitchen EPINEPHrine 0.3 mg/0.3 mL IJ SOAJ injection Inject 0.3 mLs (0.3 mg total) into the muscle once as needed for up to 1 dose for anaphylaxis. 0.3 mL 1  . fluticasone (FLONASE) 50 MCG/ACT nasal spray Place 1 spray into both nostrils daily.    . Fluticasone Propionate (XHANCE) 93 MCG/ACT EXHU Place 2 sprays into the  nose 2 (two) times daily as needed. 32 mL 2  . hydrochlorothiazide (HYDRODIURIL) 25 MG tablet Take 25 mg by mouth at bedtime.     Marland Kitchen ibuprofen (ADVIL) 800 MG  tablet Take 800 mg by mouth 2 (two) times daily.    Marland Kitchen levocetirizine (XYZAL) 5 MG tablet Take 1 tablet (5 mg total) by mouth every evening. 30 tablet 5  . metFORMIN (GLUCOPHAGE-XR) 500 MG 24 hr tablet Take 500 mg by mouth every evening.    Marland Kitchen NIFEdipine (ADALAT CC) 30 MG 24 hr tablet Take 30 mg by mouth every evening.    Renda Rolls AD 4-10-325 MG TABS Take 1 tablet by mouth 2 (two) times daily.    . Olopatadine HCl (PATADAY) 0.2 % SOLN Place 1 drop into both eyes 1 day or 1 dose. 2.5 mL 5  . PARoxetine (PAXIL) 10 MG tablet Take 10 mg by mouth every evening.   1   No current facility-administered medications for this visit.    Allergies  Allergen Reactions  . Aspirin Other (See Comments)    Childhood allergy--reaction is unknown   Review of systems: Review of systems negative except as noted in HPI / PMHx.  Past Medical History:  Diagnosis Date  . Anemia    low iron  . Anxiety   . Eczema   . History of kidney stones   . Hypertension     History reviewed. No pertinent family history.  Social History   Socioeconomic History  . Marital status: Single    Spouse name: Not on file  . Number of children: Not on file  . Years of education: Not on file  . Highest education level: Not on file  Occupational History  . Not on file  Tobacco Use  . Smoking status: Never Smoker  . Smokeless tobacco: Never Used  Substance and Sexual Activity  . Alcohol use: No  . Drug use: No  . Sexual activity: Yes    Birth control/protection: I.U.D.  Other Topics Concern  . Not on file  Social History Narrative  . Not on file   Social Determinants of Health   Financial Resource Strain:   . Difficulty of Paying Living Expenses:   Food Insecurity:   . Worried About Charity fundraiser in the Last Year:   . Arboriculturist in the Last Year:   Transportation Needs:   . Film/video editor (Medical):   Marland Kitchen Lack of Transportation (Non-Medical):   Physical Activity:   . Days of Exercise per  Week:   . Minutes of Exercise per Session:   Stress:   . Feeling of Stress :   Social Connections:   . Frequency of Communication with Friends and Family:   . Frequency of Social Gatherings with Friends and Family:   . Attends Religious Services:   . Active Member of Clubs or Organizations:   . Attends Archivist Meetings:   Marland Kitchen Marital Status:   Intimate Partner Violence:   . Fear of Current or Ex-Partner:   . Emotionally Abused:   Marland Kitchen Physically Abused:   . Sexually Abused:     I appreciate the opportunity to take part in Deborah Garza's care. Please do not hesitate to contact me with questions.  Sincerely,   R. Edgar Frisk, MD

## 2020-01-03 NOTE — Assessment & Plan Note (Signed)
   Continue avoidance of all foods causing untoward symptoms.  Should symptoms become concerning for anaphylaxis, epinephrine is to be administered and 911 is to be called immediately.

## 2020-01-03 NOTE — Assessment & Plan Note (Signed)
   The patient's history suggests upper airway cough syndrome.    Treatment plan as outlined above.  Nasal saline lavage (NeilMed) has been recommended as needed and prior to medicated nasal sprays along with instructions for proper administration.  For thick post nasal drainage, add guaifenesin 1200 mg (Mucinex Maximum Strength)  twice daily as needed with adequate hydration as discussed.

## 2020-01-03 NOTE — Assessment & Plan Note (Signed)
   Continue olopatadine eyedrops as needed and eye lubricant drops if needed.

## 2020-01-03 NOTE — Assessment & Plan Note (Signed)
Stable.  Continue appropriate allergen avoidance measures and immunotherapy injections per protocol.  Continue levocetirizine 5 mg daily if needed.  Continue Xhance, 2 sprays per nostril twice daily if needed.  Medications will be decreased or discontinued as symptom relief from immunotherapy becomes evident.

## 2020-01-03 NOTE — Patient Instructions (Addendum)
Perennial and seasonal allergic rhinitis Stable.  Continue appropriate allergen avoidance measures and immunotherapy injections per protocol.  Continue levocetirizine 5 mg daily if needed.  Continue Xhance, 2 sprays per nostril twice daily if needed.  Medications will be decreased or discontinued as symptom relief from immunotherapy becomes evident.  Allergic conjunctivitis  Continue olopatadine eyedrops as needed and eye lubricant drops if needed.  Pollen-food allergy syndrome  Continue avoidance of all foods causing untoward symptoms.  Should symptoms become concerning for anaphylaxis, epinephrine is to be administered and 911 is to be called immediately.  Coughing  The patient's history suggests upper airway cough syndrome.    Treatment plan as outlined above.  Nasal saline lavage (NeilMed) has been recommended as needed and prior to medicated nasal sprays along with instructions for proper administration.  For thick post nasal drainage, add guaifenesin 1200 mg (Mucinex Maximum Strength)  twice daily as needed with adequate hydration as discussed.   Return in about 6 months (around 07/04/2020), or if symptoms worsen or fail to improve.

## 2020-01-19 ENCOUNTER — Ambulatory Visit (INDEPENDENT_AMBULATORY_CARE_PROVIDER_SITE_OTHER): Payer: BC Managed Care – PPO

## 2020-01-19 DIAGNOSIS — J3089 Other allergic rhinitis: Secondary | ICD-10-CM

## 2020-01-24 ENCOUNTER — Encounter (HOSPITAL_COMMUNITY)
Admission: RE | Admit: 2020-01-24 | Discharge: 2020-01-24 | Disposition: A | Discharge status: Home / Self Care | Payer: BC Managed Care – PPO | Source: Ambulatory Visit | Attending: Plastic Surgery | Admitting: Plastic Surgery

## 2020-01-24 ENCOUNTER — Other Ambulatory Visit (HOSPITAL_COMMUNITY)
Admission: RE | Admit: 2020-01-24 | Discharge: 2020-01-24 | Disposition: A | Discharge status: Home / Self Care | Payer: BC Managed Care – PPO | Source: Ambulatory Visit | Attending: Plastic Surgery | Admitting: Plastic Surgery

## 2020-01-24 ENCOUNTER — Other Ambulatory Visit: Payer: Self-pay

## 2020-01-24 ENCOUNTER — Encounter (HOSPITAL_COMMUNITY): Payer: Self-pay

## 2020-01-24 DIAGNOSIS — Z01812 Encounter for preprocedural laboratory examination: Secondary | ICD-10-CM | POA: Diagnosis present

## 2020-01-24 DIAGNOSIS — Z20822 Contact with and (suspected) exposure to covid-19: Secondary | ICD-10-CM | POA: Insufficient documentation

## 2020-01-24 LAB — CBC
HCT: 45.5 % (ref 36.0–46.0)
Hemoglobin: 13.8 g/dL (ref 12.0–15.0)
MCH: 24.8 pg — ABNORMAL LOW (ref 26.0–34.0)
MCHC: 30.3 g/dL (ref 30.0–36.0)
MCV: 81.8 fL (ref 80.0–100.0)
Platelets: 254 10*3/uL (ref 150–400)
RBC: 5.56 MIL/uL — ABNORMAL HIGH (ref 3.87–5.11)
RDW: 14.6 % (ref 11.5–15.5)
WBC: 7.5 10*3/uL (ref 4.0–10.5)
nRBC: 0 % (ref 0.0–0.2)

## 2020-01-24 LAB — BASIC METABOLIC PANEL
Anion gap: 7 (ref 5–15)
BUN: 12 mg/dL (ref 6–20)
CO2: 30 mmol/L (ref 22–32)
Calcium: 8.7 mg/dL — ABNORMAL LOW (ref 8.9–10.3)
Chloride: 102 mmol/L (ref 98–111)
Creatinine, Ser: 0.97 mg/dL (ref 0.44–1.00)
GFR calc Af Amer: >60 mL/min (ref >60)
GFR calc non Af Amer: >60 mL/min (ref >60)
Glucose, Bld: 108 mg/dL — ABNORMAL HIGH (ref 70–99)
Potassium: 3.4 mmol/L — ABNORMAL LOW (ref 3.5–5.1)
Sodium: 139 mmol/L (ref 135–145)

## 2020-01-24 LAB — SARS CORONAVIRUS 2 (TAT 6-24 HRS): SARS Coronavirus 2: NEGATIVE

## 2020-01-24 LAB — GLUCOSE, CAPILLARY: Glucose-Capillary: 103 mg/dL — ABNORMAL HIGH (ref 70–99)

## 2020-01-24 NOTE — Pre-Procedure Instructions (Signed)
[id number] - [place], [place] [address] Phone: [phone] Fax: [phone]  [place], [address] [place] [address] Phone: [phone] Fax: [phone]      Your procedure is scheduled on Thursday, July 1st.  Report to [facility] Main Entrance "A" at 5:30 A.M., and check in at the Admitting office.  Call this number if you have problems the morning of surgery:  [phone]  Call [phone] if you have any questions prior to your surgery date Monday-Friday 8am-4pm    Remember:  Do not eat or drink after midnight the night before your surgery.     Take these medicines the morning of surgery with A SIP OF WATER  acetaminophen (TYLENOL) -as needed cyclobenzaprine (FLEXERIL) -as needed levocetirizine (XYZAL)-as needed Fluticasone Propionate (XHANCE)-as needed  As of today, STOP taking any Aspirin (unless otherwise instructed by your surgeon) and Aspirin containing products, Aleve, Naproxen, Ibuprofen, Motrin, Advil, Goody's, BC's, all herbal medications, fish oil, and all vitamins.  WHAT DO I DO ABOUT MY DIABETES MEDICATION?   Marland Kitchen Do not take oral diabetes medicines metFORMIN (GLUCOPHAGE-XR) the day of surgery.  . DO NOT inject SAXENDA the day of surgery.    HOW TO MANAGE YOUR DIABETES BEFORE AND AFTER SURGERY  Why is it important to control my blood sugar before and after surgery? . Improving blood sugar levels before and after surgery helps healing and can limit problems. . A way of improving blood sugar control is eating a healthy diet by: o  Eating less sugar and carbohydrates o  Increasing activity/exercise o  Talking with your doctor about reaching your blood sugar goals . High blood sugars (greater than 180 mg/dL) can raise your risk of infections and slow your recovery, so you will need  to focus on controlling your diabetes during the weeks before surgery. . Make sure that the doctor who takes care of your diabetes knows about your planned surgery including the date and location.  How do I manage my blood sugar before surgery? . Check your blood sugar at least 4 times a day, starting 2 days before surgery, to make sure that the level is not too high or low. . Check your blood sugar the morning of your surgery when you wake up and every 2 hours until you get to the Short Stay unit. o If your blood sugar is less than 70 mg/dL, you will need to treat for low blood sugar: - Do not take insulin. - Treat a low blood sugar (less than 70 mg/dL) with  cup of clear juice (cranberry or apple), 4 glucose tablets, OR glucose gel. - Recheck blood sugar in 15 minutes after treatment (to make sure it is greater than 70 mg/dL). If your blood sugar is not greater than 70 mg/dL on recheck, call [phone] for further instructions. . Report your blood sugar to the short stay nurse when you get to Short Stay.  . If you are admitted to the hospital after surgery: o Your blood sugar will be checked by the staff and you will probably be given insulin after surgery (instead of oral diabetes medicines) to make sure you have good blood sugar levels. o The goal for blood sugar control after surgery is 80-180 mg/dL.  Do not wear jewelry, make up, or nail polish            Do not wear lotions, powders, perfumes/colognes, or deodorant.            Do not shave 48 hours prior to surgery.  Men may shave face and neck.            Do not bring valuables to the hospital.            [facility] is not responsible for any belongings or valuables.  Do NOT Smoke (Tobacco/Vapping) or drink Alcohol 24 hours prior to your procedure If you use a CPAP at night, you may bring all equipment for your overnight stay.   Contacts, glasses, dentures or bridgework may not be worn into surgery.      For  patients admitted to the hospital, discharge time will be determined by your treatment team.   Patients discharged the day of surgery will not be allowed to drive home, and someone needs to stay with them for 24 hours.    Special instructions:   [facility]- Preparing For Surgery  Before surgery, you can play an important role. Because skin is not sterile, your skin needs to be as free of germs as possible. You can reduce the number of germs on your skin by washing with CHG (chlorahexidine gluconate) Soap before surgery.  CHG is an antiseptic cleaner which kills germs and bonds with the skin to continue killing germs even after washing.    Oral Hygiene is also important to reduce your risk of infection.  Remember - BRUSH YOUR TEETH THE MORNING OF SURGERY WITH YOUR REGULAR TOOTHPASTE  Please do not use if you have an allergy to CHG or antibacterial soaps. If your skin becomes reddened/irritated stop using the CHG.  Do not shave (including legs and underarms) for at least 48 hours prior to first CHG shower. It is OK to shave your face.  Please follow these instructions carefully.   1. Shower the NIGHT BEFORE SURGERY and the MORNING OF SURGERY with CHG Soap.   2. If you chose to wash your hair, wash your hair first as usual with your normal shampoo.  3. After you shampoo, rinse your hair and body thoroughly to remove the shampoo.  4. Use CHG as you would any other liquid soap. You can apply CHG directly to the skin and wash gently with a scrungie or a clean washcloth.   5. Apply the CHG Soap to your body ONLY FROM THE NECK DOWN.  Do not use on open wounds or open sores. Avoid contact with your eyes, ears, mouth and genitals (private parts). Wash Face and genitals (private parts)  with your normal soap.   6. Wash thoroughly, paying special attention to the area where your surgery will be performed.  7. Thoroughly rinse your body with warm water from the neck down.  8. DO NOT shower/wash  with your normal soap after using and rinsing off the CHG Soap.  9. Pat yourself dry with a CLEAN TOWEL.  10. Wear CLEAN PAJAMAS to bed the night before surgery, wear comfortable clothes the morning of surgery  11. Place CLEAN SHEETS on your bed the night of your first shower and DO NOT SLEEP WITH PETS.   Day of Surgery:   Do not apply any deodorants/lotions.  Please wear clean clothes to the hospital/surgery center.   Remember to brush your teeth WITH YOUR REGULAR TOOTHPASTE.   Please read  over the following fact sheets that you were given.

## 2020-01-24 NOTE — Progress Notes (Signed)
PCP -Dr. Iona Beard  Cardiologist - denies  PPM/ICD - N/A Device Orders -N/A  Rep Notified - N/A  Chest x-ray - 04/20/19 EKG - 04/21/19 Stress Test - denies ECHO - denies Cardiac Cath - denies  Sleep Study - denies CPAP - denies  Fasting Blood Sugar - 93-100 Checks Blood Sugar 1-2 x's a week  Blood Thinner Instructions: N/A Aspirin Instructions:N/A  ERAS Protcol -N/A PRE-SURGERY Ensure or G2-N/A   COVID TEST- 01/24/20   Anesthesia review: No  Patient denies shortness of breath, fever, cough and chest pain at PAT appointment   All instructions explained to the patient, with a verbal understanding of the material. Patient agrees to go over the instructions while at home for a better understanding. Patient also instructed to self quarantine after being tested for COVID-19. The opportunity to ask questions was provided.    Coronavirus Screening  Have you experienced the following symptoms:  Cough yes/no: No Fever (>100.16F)  yes/no: No Runny nose yes/no: No Sore throat yes/no: No Difficulty breathing/shortness of breath  yes/no: No  Have you or a family member traveled in the last 14 days and where? yes/no: No   If the patient indicates "YES" to the above questions, their PAT will be rescheduled to limit the exposure to others and, the surgeon will be notified. THE PATIENT WILL NEED TO BE ASYMPTOMATIC FOR 14 DAYS.   If the patient is not experiencing any of these symptoms, the PAT nurse will instruct them to NOT bring anyone with them to their appointment since they may have these symptoms or traveled as well.   Please remind your patients and families that hospital visitation restrictions are in effect and the importance of the restrictions.

## 2020-01-25 ENCOUNTER — Ambulatory Visit (INDEPENDENT_AMBULATORY_CARE_PROVIDER_SITE_OTHER): Payer: BC Managed Care – PPO

## 2020-01-25 DIAGNOSIS — J3089 Other allergic rhinitis: Secondary | ICD-10-CM | POA: Diagnosis not present

## 2020-01-26 ENCOUNTER — Ambulatory Visit: Payer: Self-pay | Admitting: Plastic Surgery

## 2020-01-26 NOTE — Anesthesia Preprocedure Evaluation (Addendum)
Anesthesia Evaluation  Patient identified by MRN, date of birth, ID band Patient awake    Reviewed: Allergy & Precautions, H&P , NPO status , Patient's Chart, lab work & pertinent test results  Airway Mallampati: III  TM Distance: >3 FB Neck ROM: Full    Dental no notable dental hx. (+) Teeth Intact, Dental Advisory Given   Pulmonary neg pulmonary ROS,    Pulmonary exam normal breath sounds clear to auscultation       Cardiovascular Exercise Tolerance: Good hypertension, Pt. on medications  Rhythm:Regular Rate:Normal     Neuro/Psych Anxiety negative neurological ROS     GI/Hepatic negative GI ROS, Neg liver ROS,   Endo/Other  Morbid obesity  Renal/GU negative Renal ROS  negative genitourinary   Musculoskeletal   Abdominal   Peds  Hematology  (+) Blood dyscrasia, anemia ,   Anesthesia Other Findings   Reproductive/Obstetrics negative OB ROS                            Anesthesia Physical Anesthesia Plan  ASA: III  Anesthesia Plan: General   Post-op Pain Management:    Induction: Intravenous  PONV Risk Score and Plan: 4 or greater and Ondansetron, Dexamethasone and Midazolam  Airway Management Planned: Oral ETT  Additional Equipment:   Intra-op Plan:   Post-operative Plan: Extubation in OR  Informed Consent: I have reviewed the patients History and Physical, chart, labs and discussed the procedure including the risks, benefits and alternatives for the proposed anesthesia with the patient or authorized representative who has indicated his/her understanding and acceptance.       Plan Discussed with: CRNA and Surgeon  Anesthesia Plan Comments:        Anesthesia Quick Evaluation

## 2020-01-27 ENCOUNTER — Ambulatory Visit (HOSPITAL_COMMUNITY)
Admission: RE | Admit: 2020-01-27 | Discharge: 2020-01-27 | Disposition: A | Discharge status: Home / Self Care | Payer: BC Managed Care – PPO | Attending: Plastic Surgery | Admitting: Plastic Surgery

## 2020-01-27 ENCOUNTER — Encounter (HOSPITAL_COMMUNITY)
Admission: RE | Disposition: A | Discharge status: Home / Self Care | Payer: Self-pay | Source: Home / Self Care | Attending: Plastic Surgery

## 2020-01-27 ENCOUNTER — Encounter (HOSPITAL_COMMUNITY): Payer: Self-pay | Admitting: Plastic Surgery

## 2020-01-27 ENCOUNTER — Ambulatory Visit (HOSPITAL_COMMUNITY): Payer: BC Managed Care – PPO | Admitting: Certified Registered Nurse Anesthetist

## 2020-01-27 DIAGNOSIS — I10 Essential (primary) hypertension: Secondary | ICD-10-CM | POA: Insufficient documentation

## 2020-01-27 DIAGNOSIS — F419 Anxiety disorder, unspecified: Secondary | ICD-10-CM | POA: Diagnosis not present

## 2020-01-27 DIAGNOSIS — Z803 Family history of malignant neoplasm of breast: Secondary | ICD-10-CM | POA: Diagnosis not present

## 2020-01-27 DIAGNOSIS — R7303 Prediabetes: Secondary | ICD-10-CM | POA: Diagnosis not present

## 2020-01-27 DIAGNOSIS — Z7984 Long term (current) use of oral hypoglycemic drugs: Secondary | ICD-10-CM | POA: Diagnosis not present

## 2020-01-27 DIAGNOSIS — Z6841 Body Mass Index (BMI) 40.0 and over, adult: Secondary | ICD-10-CM | POA: Diagnosis not present

## 2020-01-27 DIAGNOSIS — N62 Hypertrophy of breast: Secondary | ICD-10-CM | POA: Insufficient documentation

## 2020-01-27 DIAGNOSIS — Z79899 Other long term (current) drug therapy: Secondary | ICD-10-CM | POA: Diagnosis not present

## 2020-01-27 HISTORY — PX: BREAST RECONSTRUCTION: SHX9

## 2020-01-27 LAB — GLUCOSE, CAPILLARY
Glucose-Capillary: 115 mg/dL — ABNORMAL HIGH (ref 70–99)
Glucose-Capillary: 159 mg/dL — ABNORMAL HIGH (ref 70–99)

## 2020-01-27 SURGERY — RECONSTRUCTION, BREAST
Anesthesia: General | Laterality: Bilateral

## 2020-01-27 MED ORDER — ROCURONIUM BROMIDE 10 MG/ML (PF) SYRINGE
PREFILLED_SYRINGE | INTRAVENOUS | Status: AC
  Filled 2020-01-27: qty 10

## 2020-01-27 MED ORDER — LIDOCAINE 2% (20 MG/ML) 5 ML SYRINGE
INTRAMUSCULAR | Status: DC | PRN
  Administered 2020-01-27: 60 mg via INTRAVENOUS

## 2020-01-27 MED ORDER — CHLORHEXIDINE GLUCONATE 0.12 % MT SOLN: 15.0000 mL | Freq: Once | OROMUCOSAL | Status: AC

## 2020-01-27 MED ORDER — FENTANYL CITRATE (PF) 250 MCG/5ML IJ SOLN
INTRAMUSCULAR | Status: AC
  Filled 2020-01-27: qty 5

## 2020-01-27 MED ORDER — MIDAZOLAM HCL 2 MG/2ML IJ SOLN
INTRAMUSCULAR | Status: AC
  Filled 2020-01-27: qty 2

## 2020-01-27 MED ORDER — LIDOCAINE-EPINEPHRINE 1 %-1:100000 IJ SOLN
INTRAMUSCULAR | Status: DC | PRN
  Administered 2020-01-27: 40 mL

## 2020-01-27 MED ORDER — PROPOFOL 10 MG/ML IV BOLUS
INTRAVENOUS | Status: AC
  Filled 2020-01-27: qty 40

## 2020-01-27 MED ORDER — CHLORHEXIDINE GLUCONATE 0.12 % MT SOLN
OROMUCOSAL | Status: AC
  Administered 2020-01-27: 15 mL via OROMUCOSAL
  Filled 2020-01-27: qty 15

## 2020-01-27 MED ORDER — SUGAMMADEX SODIUM 200 MG/2ML IV SOLN
INTRAVENOUS | Status: DC | PRN
  Administered 2020-01-27: 274 mg via INTRAVENOUS

## 2020-01-27 MED ORDER — SODIUM CHLORIDE (PF) 0.9 % IJ SOLN
INTRAMUSCULAR | Status: AC
  Filled 2020-01-27: qty 20

## 2020-01-27 MED ORDER — LACTATED RINGERS IV SOLN: INTRAVENOUS | Status: DC

## 2020-01-27 MED ORDER — DEXAMETHASONE SODIUM PHOSPHATE 10 MG/ML IJ SOLN
INTRAMUSCULAR | Status: AC
  Filled 2020-01-27: qty 1

## 2020-01-27 MED ORDER — LIDOCAINE-EPINEPHRINE 1 %-1:100000 IJ SOLN
INTRAMUSCULAR | Status: AC
  Filled 2020-01-27: qty 2

## 2020-01-27 MED ORDER — DEXAMETHASONE SODIUM PHOSPHATE 10 MG/ML IJ SOLN
INTRAMUSCULAR | Status: DC | PRN
Start: 2020-01-27 — End: 2020-01-27
  Administered 2020-01-27: 5 mg via INTRAVENOUS

## 2020-01-27 MED ORDER — ACETAMINOPHEN 500 MG PO TABS
ORAL_TABLET | ORAL | Status: AC
  Administered 2020-01-27: 1000 mg via ORAL
  Filled 2020-01-27: qty 2

## 2020-01-27 MED ORDER — HYDROMORPHONE HCL 1 MG/ML IJ SOLN
0.2500 mg | INTRAMUSCULAR | Status: DC | PRN
  Administered 2020-01-27 (×2): 0.25 mg via INTRAVENOUS

## 2020-01-27 MED ORDER — BUPIVACAINE LIPOSOME 1.3 % IJ SUSP
INTRAMUSCULAR | Status: DC | PRN
  Administered 2020-01-27: 20 mL

## 2020-01-27 MED ORDER — CHLORHEXIDINE GLUCONATE CLOTH 2 % EX PADS: 6.0000 | MEDICATED_PAD | Freq: Once | CUTANEOUS | Status: DC

## 2020-01-27 MED ORDER — PROPOFOL 10 MG/ML IV BOLUS
INTRAVENOUS | Status: DC | PRN
  Administered 2020-01-27: 200 mg via INTRAVENOUS

## 2020-01-27 MED ORDER — SUCCINYLCHOLINE CHLORIDE 20 MG/ML IJ SOLN
INTRAMUSCULAR | Status: DC | PRN
  Administered 2020-01-27: 120 mg via INTRAVENOUS

## 2020-01-27 MED ORDER — BUPIVACAINE HCL (PF) 0.25 % IJ SOLN
INTRAMUSCULAR | Status: AC
  Filled 2020-01-27: qty 60

## 2020-01-27 MED ORDER — BUPIVACAINE HCL (PF) 0.25 % IJ SOLN
INTRAMUSCULAR | Status: DC | PRN
  Administered 2020-01-27: 60 mL

## 2020-01-27 MED ORDER — FENTANYL CITRATE (PF) 100 MCG/2ML IJ SOLN
INTRAMUSCULAR | Status: DC | PRN
  Administered 2020-01-27: 100 ug via INTRAVENOUS
  Administered 2020-01-27: 25 ug via INTRAVENOUS
  Administered 2020-01-27 (×3): 50 ug via INTRAVENOUS
  Administered 2020-01-27: 25 ug via INTRAVENOUS
  Administered 2020-01-27: 50 ug via INTRAVENOUS

## 2020-01-27 MED ORDER — SUCCINYLCHOLINE CHLORIDE 200 MG/10ML IV SOSY
PREFILLED_SYRINGE | INTRAVENOUS | Status: AC
  Filled 2020-01-27: qty 10

## 2020-01-27 MED ORDER — ONDANSETRON HCL 4 MG/2ML IJ SOLN
INTRAMUSCULAR | Status: AC
  Filled 2020-01-27: qty 2

## 2020-01-27 MED ORDER — LACTATED RINGERS IV SOLN: INTRAVENOUS | Status: DC | PRN

## 2020-01-27 MED ORDER — ROCURONIUM BROMIDE 10 MG/ML (PF) SYRINGE
PREFILLED_SYRINGE | INTRAVENOUS | Status: DC | PRN
  Administered 2020-01-27: 30 mg via INTRAVENOUS
  Administered 2020-01-27: 40 mg via INTRAVENOUS
  Administered 2020-01-27 (×3): 50 mg via INTRAVENOUS
  Administered 2020-01-27: 30 mg via INTRAVENOUS

## 2020-01-27 MED ORDER — ORAL CARE MOUTH RINSE: 15.0000 mL | Freq: Once | OROMUCOSAL | Status: AC

## 2020-01-27 MED ORDER — BACITRACIN ZINC 500 UNIT/GM EX OINT
TOPICAL_OINTMENT | CUTANEOUS | Status: AC
  Filled 2020-01-27: qty 28.35

## 2020-01-27 MED ORDER — MIDAZOLAM HCL 5 MG/5ML IJ SOLN
INTRAMUSCULAR | Status: DC | PRN
  Administered 2020-01-27: 2 mg via INTRAVENOUS

## 2020-01-27 MED ORDER — CEFAZOLIN SODIUM-DEXTROSE 2-4 GM/100ML-% IV SOLN
2.0000 g | INTRAVENOUS | Status: AC
  Administered 2020-01-27: 3 g via INTRAVENOUS

## 2020-01-27 MED ORDER — LIDOCAINE 2% (20 MG/ML) 5 ML SYRINGE
INTRAMUSCULAR | Status: AC
  Filled 2020-01-27: qty 5

## 2020-01-27 MED ORDER — ONDANSETRON HCL 4 MG/2ML IJ SOLN
INTRAMUSCULAR | Status: DC | PRN
  Administered 2020-01-27: 4 mg via INTRAVENOUS

## 2020-01-27 MED ORDER — HYDROMORPHONE HCL 1 MG/ML IJ SOLN
INTRAMUSCULAR | Status: AC
  Filled 2020-01-27: qty 1

## 2020-01-27 MED ORDER — SODIUM CHLORIDE (PF) 0.9 % IJ SOLN
INTRAMUSCULAR | Status: DC | PRN
  Administered 2020-01-27: 60 mL via INTRAVENOUS

## 2020-01-27 MED ORDER — CEFAZOLIN SODIUM-DEXTROSE 2-4 GM/100ML-% IV SOLN
INTRAVENOUS | Status: AC
  Filled 2020-01-27: qty 100

## 2020-01-27 MED ORDER — PHENYLEPHRINE HCL-NACL 10-0.9 MG/250ML-% IV SOLN
INTRAVENOUS | Status: DC | PRN
Start: 2020-01-27 — End: 2020-01-27
  Administered 2020-01-27: 20 ug/min via INTRAVENOUS

## 2020-01-27 MED ORDER — ACETAMINOPHEN 500 MG PO TABS: 1000.0000 mg | ORAL_TABLET | Freq: Once | ORAL | Status: AC

## 2020-01-27 SURGICAL SUPPLY — 59 items
ALCOHOL 70% 16 OZ (MISCELLANEOUS) ×3 IMPLANT
ATCH SMKEVC FLXB CAUT HNDSWH (FILTER) ×1 IMPLANT
BLADE KNIFE  10 PERSONNA (BLADE) ×6 IMPLANT
BLADE KNIFE PERSONA 15 (BLADE) ×6 IMPLANT
BNDG GAUZE ELAST 4 BULKY (GAUZE/BANDAGES/DRESSINGS) ×6 IMPLANT
CANISTER SUCT 3000ML PPV (MISCELLANEOUS) ×3 IMPLANT
CLOSURE WOUND 1/2 X4 (GAUZE/BANDAGES/DRESSINGS) ×4
COUNTER NEEDLE 20 DBL MAG RED (NEEDLE) ×3 IMPLANT
COVER WAND RF STERILE (DRAPES) ×3 IMPLANT
DRAIN CHANNEL 10F 3/8 F FF (DRAIN) ×6 IMPLANT
DRAPE HALF SHEET 40X57 (DRAPES) ×6 IMPLANT
DRAPE ORTHO SPLIT 77X108 STRL (DRAPES) ×6
DRAPE SURG ORHT 6 SPLT 77X108 (DRAPES) ×2 IMPLANT
DRSG ADAPTIC 3X8 NADH LF (GAUZE/BANDAGES/DRESSINGS) ×4 IMPLANT
DRSG PAD ABDOMINAL 8X10 ST (GAUZE/BANDAGES/DRESSINGS) ×10 IMPLANT
DRSG TELFA 3X8 NADH (GAUZE/BANDAGES/DRESSINGS) ×12 IMPLANT
ELECT CAUTERY BLADE 6.4 (BLADE) ×3 IMPLANT
ELECT REM PT RETURN 9FT ADLT (ELECTROSURGICAL) ×3
ELECTRODE REM PT RTRN 9FT ADLT (ELECTROSURGICAL) ×1 IMPLANT
EVACUATOR SILICONE 100CC (DRAIN) ×6 IMPLANT
EVACUATOR SMOKE ACCUVAC VALLEY (FILTER)
GAUZE SPONGE 4X4 12PLY STRL (GAUZE/BANDAGES/DRESSINGS) ×3 IMPLANT
GLOVE BIO SURGEON STRL SZ 6.5 (GLOVE) ×6 IMPLANT
GLOVE BIO SURGEONS STRL SZ 6.5 (GLOVE) ×4
GOWN STRL REUS W/ TWL LRG LVL3 (GOWN DISPOSABLE) ×2 IMPLANT
GOWN STRL REUS W/TWL LRG LVL3 (GOWN DISPOSABLE) ×6
KIT BASIN OR (CUSTOM PROCEDURE TRAY) ×3 IMPLANT
KIT TURNOVER KIT B (KITS) ×3 IMPLANT
MARKER SKIN DUAL TIP RULER LAB (MISCELLANEOUS) ×3 IMPLANT
NDL HYPO 25GX1X1/2 BEV (NEEDLE) ×1 IMPLANT
NDL SPNL 18GX3.5 QUINCKE PK (NEEDLE) ×1 IMPLANT
NEEDLE HYPO 25GX1X1/2 BEV (NEEDLE) ×3 IMPLANT
NEEDLE SPNL 18GX3.5 QUINCKE PK (NEEDLE) ×3 IMPLANT
PACK GENERAL/GYN (CUSTOM PROCEDURE TRAY) ×3 IMPLANT
PAD ARMBOARD 7.5X6 YLW CONV (MISCELLANEOUS) ×6 IMPLANT
PAD DRESSING TELFA 3X8 NADH (GAUZE/BANDAGES/DRESSINGS) IMPLANT
PIN SAFETY STERILE (MISCELLANEOUS) ×3 IMPLANT
PREFILTER EVAC NS 1 1/3-3/8IN (MISCELLANEOUS) ×1 IMPLANT
PREFILTER SMOKE EVAC (FILTER) ×1 IMPLANT
SPECIMEN JAR LARGE (MISCELLANEOUS) ×3 IMPLANT
SPONGE LAP 18X18 RF (DISPOSABLE) ×4 IMPLANT
STAPLER VISISTAT 35W (STAPLE) ×6 IMPLANT
STRIP CLOSURE SKIN 1/2X4 (GAUZE/BANDAGES/DRESSINGS) ×7 IMPLANT
SUT ETHILON 3 0 PS 1 (SUTURE) ×3 IMPLANT
SUT MNCRL AB 3-0 PS2 18 (SUTURE) ×16 IMPLANT
SUT MNCRL AB 4-0 PS2 18 (SUTURE) ×7 IMPLANT
SUT MON AB 5-0 PS2 18 (SUTURE) ×6 IMPLANT
SUT PROLENE 2 0 CT2 30 (SUTURE) ×3 IMPLANT
SUT PROLENE 3 0 PS 1 (SUTURE) ×4 IMPLANT
SUT VLOC 180 0 9IN  GS21 (SUTURE)
SUT VLOC 180 0 9IN GS21 (SUTURE) IMPLANT
SUT VLOC 90 P-14 23 (SUTURE) ×6 IMPLANT
SYR BULB IRRIG 60ML STRL (SYRINGE) ×3 IMPLANT
SYR CONTROL 10ML LL (SYRINGE) ×3 IMPLANT
TAPE MEASURE VINYL STERILE (MISCELLANEOUS) ×2 IMPLANT
TRAY FOLEY W/BAG SLVR 16FR (SET/KITS/TRAYS/PACK) ×3
TRAY FOLEY W/BAG SLVR 16FR ST (SET/KITS/TRAYS/PACK) ×1 IMPLANT
TUBE CONNECTING 12'X1/4 (SUCTIONS) ×2
TUBE CONNECTING 12X1/4 (SUCTIONS) ×4 IMPLANT

## 2020-01-27 NOTE — Anesthesia Procedure Notes (Signed)
Procedure Name: Intubation Date/Time: 01/27/2020 7:53 AM Performed by: Malachi Paradise, RN Pre-anesthesia Checklist: Patient identified, Emergency Drugs available, Suction available and Patient being monitored Patient Re-evaluated:Patient Re-evaluated prior to induction Oxygen Delivery Method: Circle System Utilized Preoxygenation: Pre-oxygenation with 100% oxygen Induction Type: IV induction Ventilation: Mask ventilation without difficulty Laryngoscope Size: Glidescope and 4 Grade View: Grade I Tube type: Oral Tube size: 7.0 mm Number of attempts: 1 Airway Equipment and Method: Stylet and Oral airway Placement Confirmation: ETT inserted through vocal cords under direct vision,  positive ETCO2 and breath sounds checked- equal and bilateral Secured at: 22 cm Tube secured with: Tape Dental Injury: Teeth and Oropharynx as per pre-operative assessment  Comments: Intubated by Arvella Nigh

## 2020-01-27 NOTE — Op Note (Signed)
OPERATIVE REPORT  01/27/2020  [name]  PREOPERATIVE DIAGNOSIS:  Bilateral macromastia.  POSTOPERATIVE DIAGNOSIS:  Bilateral macromastia.  PROCEDURE:  Bilateral reduction mammoplasties.  ATTENDING SURGEON:  Youlanda Roys, MD  ANESTHESIA:  General.  ANESTHESIOLOGIST:  , MD  COMPLICATIONS:  None.  INDICATIONS FOR THE PROCEDURE:  The patient is a 39 y.o. female who has bilateral macromastia that is clinically symptomatic.  She presents to undergo bilateral reduction mammoplasties.  DESCRIPTION OF PROCEDURE:  The patient was marked in preop holding area in a pattern of Wise for the future bilateral reduction mammoplasties. She was then taken back to the OR, placed on the table in supine position.  After adequate general anesthesia was obtained, the patient's chest was prepped with Techni-Care and draped in sterile fashion.  The bases of the breasts have been infiltrated with 1% lidocaine with epinephrine.  After adequate hemostasis and anesthesia taken effect, the procedure was begun.  Both of the breast reductions were performed in the following similar manner.  The nipple-areolar complex was marked with a 45-mm nipple marker.  The skin was then incised and deepithelialized around the nipple-areolar complex down to the inframammary crease in the inferior pedicle pattern.  Next, the medial, superior, and lateral skin flaps were elevated down to the chest wall.  Excess fat and glandular tissue removed from the inferior pedicle.  The nipple-areolar complex was examined and found to be pink and viable.  The wound was irrigated with saline irrigation.  Meticulous hemostasis was obtained with the Bovie electrocautery.  Inferior pedicle was centralized using 3-0 Prolene suture.  A #10 JP flat fully fluted drain was placed into the wound. The skin flaps were brought together at the inverted T junction with a 2- 0 Prolene suture.  The incisions were stapled for  temporary closure. The breasts compared and found to have good shape and symmetry.  The incisions were then closed from the medial aspect of the JP drain to the medial aspect of the [facility] incision by first placing a few 3-0 Monocryl sutures to tack together the dermal layer, and then both the dermal and cuticular layer were closed in a single layer using a 3-0 V-lock barbed suture.  Lateral to the JP drain incision was closed using 3-0 Monocryl in the dermal layer, followed by 3-0 Monocryl running intracuticular stitch on the skin.  The vertical limb of the Wise pattern was closed in the dermal layer using 3-0 Monocryl suture.  The patient was placed in the upright position.  The future location of the nipple-areolar complexes was marked on both breast mounds using the 45-mm nipple marker.  She was then placed back in the recumbent position.  Both of the nipple areolar complexes were brought out onto the breast mounds in the following similar manner.  The skin was incised as marked and removed in full thickness into the subcutaneous tissues.  The nipple- areolar complex was examined, found to be pink and viable, then brought out through this aperture and sewn in place using 4-0 Monocryl in the dermal layer, followed by 5-0 Monocryl running intracuticular stitch on the skin.  This 5-0 Monocryl suture was then brought down to close the cuticular layer of the vertical limb as well.  The JP drain was sewn in place using 3-0 nylon suture.  The pectoralis major muscle and fascia along with the breast and chest soft tissues were then infiltrated with 1% Exparel (total 266 mg).  Now the [facility] incision was also infiltrated with  the Exparel in order to give the patient postoperative pain control.  The incisions were dressed with benzoin, Steri-Strips, and the nipples dressed with bacitracin ointment and Adaptic.  4x4s were placed over the incisions and ABD pads in the axillary areas.  The patient  was placed into a light postoperative support bra.  There were no complications. The patient tolerated the procedure well.  The final needle, sponge counts were reported to be correct at the end of the case.  The patient was then recovered without complications.  Both the patient and her family were given proper postoperative wound care instructions. She was then discharged home in the care of her family in stable condition.  Follow up will be with me in a few days in the office.         Youlanda Roys, M.D.  01/27/2020 1:44 PM

## 2020-01-27 NOTE — Progress Notes (Signed)
Wallet and phone given to [employer] (mother) per patient's request.

## 2020-01-27 NOTE — H&P (Signed)
  H&P faxed to surgical center.  -History and Physical Reviewed  -Patient has been re-examined  -No change in the plan of care  Deborah Garza A    

## 2020-01-27 NOTE — Brief Op Note (Signed)
01/27/2020  1:42 PM  PATIENT:  Deborah Garza  39 y.o. female  PRE-OPERATIVE DIAGNOSIS:  BILATERAL MACROMASTIA  POST-OPERATIVE DIAGNOSIS:  BILATERAL MACROMASTIA  PROCEDURE:  Procedure(s): BILATERAL BREAST RECONSTRUCTION (Bilateral)  SURGEON:  Surgeon(s) and Role:    * Dandra Velardi, Audrea Muscat, MD - Primary  ASSISTANTS: Cheryl RNFA  ANESTHESIA:   general  EBL:  100 mL   BLOOD ADMINISTERED:none  DRAINS: (75F) Jackson-Pratt drain(s) with closed bulb suction in the Bilateral Breasts   LOCAL MEDICATIONS USED:  1.3% Exparel (total 266 mgs.)  SPECIMEN:  Source of Specimen:  Bilateral Breasts  DISPOSITION OF SPECIMEN:  PATHOLOGY  COUNTS:  YES  DICTATION: .Note written in EPIC  PLAN OF CARE: Discharge to home after PACU  PATIENT DISPOSITION:  PACU - hemodynamically stable.   Delay start of Pharmacological VTE agent (>24hrs) due to surgical blood loss or risk of bleeding: not applicable

## 2020-01-27 NOTE — Discharge Instructions (Signed)
1. No lifting greater than 5 lbs with arms for 4 weeks. 2. Empty, strip, record and reactivate JP drains 3 times a day. 3. Percocet 5/325 mg tabs 1-2 tabs po q 4-6 hours prn pain- prescription given in office. 4. Duricef 1 tab po bid- prescription given in office. 5. Sterapred dose pack as directed- prescription given in office. 6. Follow-up appointment Friday in office.

## 2020-01-27 NOTE — Transfer of Care (Signed)
Immediate Anesthesia Transfer of Care Note  Patient: MIJA EFFERTZ  Procedure(s) Performed: BILATERAL BREAST RECONSTRUCTION (Bilateral )  Patient Location: PACU  Anesthesia Type:General  Level of Consciousness: awake and alert   Airway & Oxygen Therapy: Patient Spontanous Breathing and Patient connected to face mask oxygen  Post-op Assessment: Report given to RN and Post -op Vital signs reviewed and stable  Post vital signs: Reviewed and stable  Last Vitals:  Vitals Value Taken Time  BP 139/91 01/27/20 1358  Temp    Pulse 91 01/27/20 1400  Resp 18 01/27/20 1400  SpO2 100 % 01/27/20 1400  Vitals shown include unvalidated device data.  Last Pain:  Vitals:   01/27/20 0633  TempSrc: Temporal  PainSc:          Complications: No complications documented.

## 2020-01-28 ENCOUNTER — Encounter (HOSPITAL_COMMUNITY): Payer: Self-pay | Admitting: Plastic Surgery

## 2020-01-28 LAB — SURGICAL PATHOLOGY

## 2020-01-28 NOTE — Anesthesia Postprocedure Evaluation (Signed)
Anesthesia Post Note  Patient: Deborah Garza  Procedure(s) Performed: BILATERAL BREAST RECONSTRUCTION (Bilateral )     Patient location during evaluation: PACU Anesthesia Type: General Level of consciousness: sedated and patient cooperative Pain management: pain level controlled Vital Signs Assessment: post-procedure vital signs reviewed and stable Respiratory status: spontaneous breathing Cardiovascular status: stable Anesthetic complications: no   No complications documented.  Last Vitals:  Vitals:   01/27/20 1430 01/27/20 1440  BP: 125/81 (!) 130/91  Pulse: 91   Resp: 15   Temp: 36.4 C 36.4 C  SpO2: 100% 100%    Last Pain:  Vitals:   01/27/20 1440  TempSrc:   PainSc: [place]

## 2020-02-04 ENCOUNTER — Ambulatory Visit (INDEPENDENT_AMBULATORY_CARE_PROVIDER_SITE_OTHER): Payer: BC Managed Care – PPO

## 2020-02-04 DIAGNOSIS — J309 Allergic rhinitis, unspecified: Secondary | ICD-10-CM

## 2020-02-10 ENCOUNTER — Ambulatory Visit (INDEPENDENT_AMBULATORY_CARE_PROVIDER_SITE_OTHER): Payer: BC Managed Care – PPO

## 2020-02-10 DIAGNOSIS — J309 Allergic rhinitis, unspecified: Secondary | ICD-10-CM

## 2020-02-17 ENCOUNTER — Ambulatory Visit (INDEPENDENT_AMBULATORY_CARE_PROVIDER_SITE_OTHER): Payer: BC Managed Care – PPO

## 2020-02-17 DIAGNOSIS — J309 Allergic rhinitis, unspecified: Secondary | ICD-10-CM

## 2020-03-10 ENCOUNTER — Ambulatory Visit (INDEPENDENT_AMBULATORY_CARE_PROVIDER_SITE_OTHER): Payer: BC Managed Care – PPO | Admitting: *Deleted

## 2020-03-10 DIAGNOSIS — J309 Allergic rhinitis, unspecified: Secondary | ICD-10-CM

## 2020-03-17 ENCOUNTER — Ambulatory Visit (INDEPENDENT_AMBULATORY_CARE_PROVIDER_SITE_OTHER): Payer: BC Managed Care – PPO | Admitting: *Deleted

## 2020-03-17 DIAGNOSIS — J309 Allergic rhinitis, unspecified: Secondary | ICD-10-CM | POA: Diagnosis not present

## 2020-03-23 MED FILL — WEGOVY 0.25 MG/0.5ML SOAJ: 0.25 | 28 days supply | Qty: 2 | Fill #0

## 2020-03-27 ENCOUNTER — Ambulatory Visit (INDEPENDENT_AMBULATORY_CARE_PROVIDER_SITE_OTHER): Payer: BC Managed Care – PPO

## 2020-03-27 DIAGNOSIS — J309 Allergic rhinitis, unspecified: Secondary | ICD-10-CM | POA: Diagnosis not present

## 2020-04-12 ENCOUNTER — Ambulatory Visit (INDEPENDENT_AMBULATORY_CARE_PROVIDER_SITE_OTHER): Payer: BC Managed Care – PPO | Admitting: *Deleted

## 2020-04-12 DIAGNOSIS — J309 Allergic rhinitis, unspecified: Secondary | ICD-10-CM | POA: Diagnosis not present

## 2020-04-21 ENCOUNTER — Ambulatory Visit (INDEPENDENT_AMBULATORY_CARE_PROVIDER_SITE_OTHER): Payer: BC Managed Care – PPO

## 2020-04-21 DIAGNOSIS — J309 Allergic rhinitis, unspecified: Secondary | ICD-10-CM

## 2020-04-26 MED FILL — WEGOVY 0.5 MG/0.5ML SOAJ: 0.5 | 28 days supply | Qty: 2 | Fill #0

## 2020-04-28 ENCOUNTER — Ambulatory Visit (INDEPENDENT_AMBULATORY_CARE_PROVIDER_SITE_OTHER): Payer: BC Managed Care – PPO | Admitting: *Deleted

## 2020-04-28 DIAGNOSIS — J309 Allergic rhinitis, unspecified: Secondary | ICD-10-CM

## 2020-05-09 ENCOUNTER — Ambulatory Visit (INDEPENDENT_AMBULATORY_CARE_PROVIDER_SITE_OTHER): Payer: BC Managed Care – PPO

## 2020-05-09 DIAGNOSIS — J309 Allergic rhinitis, unspecified: Secondary | ICD-10-CM

## 2020-05-19 ENCOUNTER — Ambulatory Visit (INDEPENDENT_AMBULATORY_CARE_PROVIDER_SITE_OTHER): Payer: BC Managed Care – PPO | Admitting: *Deleted

## 2020-05-19 DIAGNOSIS — J309 Allergic rhinitis, unspecified: Secondary | ICD-10-CM

## 2020-05-30 MED FILL — WEGOVY 1 MG/0.5ML SOAJ: 1 | 28 days supply | Qty: 2 | Fill #0

## 2020-06-08 ENCOUNTER — Ambulatory Visit (INDEPENDENT_AMBULATORY_CARE_PROVIDER_SITE_OTHER): Payer: BC Managed Care – PPO

## 2020-06-08 DIAGNOSIS — J309 Allergic rhinitis, unspecified: Secondary | ICD-10-CM | POA: Diagnosis not present

## 2020-06-16 ENCOUNTER — Ambulatory Visit (INDEPENDENT_AMBULATORY_CARE_PROVIDER_SITE_OTHER): Payer: BC Managed Care – PPO

## 2020-06-16 DIAGNOSIS — J309 Allergic rhinitis, unspecified: Secondary | ICD-10-CM | POA: Diagnosis not present

## 2020-06-30 ENCOUNTER — Ambulatory Visit (INDEPENDENT_AMBULATORY_CARE_PROVIDER_SITE_OTHER): Payer: BC Managed Care – PPO | Admitting: *Deleted

## 2020-06-30 DIAGNOSIS — J309 Allergic rhinitis, unspecified: Secondary | ICD-10-CM | POA: Diagnosis not present

## 2020-07-03 ENCOUNTER — Ambulatory Visit: Payer: BC Managed Care – PPO | Admitting: Allergy and Immunology

## 2020-07-05 ENCOUNTER — Ambulatory Visit (INDEPENDENT_AMBULATORY_CARE_PROVIDER_SITE_OTHER): Payer: BC Managed Care – PPO | Admitting: *Deleted

## 2020-07-05 DIAGNOSIS — J309 Allergic rhinitis, unspecified: Secondary | ICD-10-CM | POA: Diagnosis not present

## 2020-07-13 ENCOUNTER — Ambulatory Visit (INDEPENDENT_AMBULATORY_CARE_PROVIDER_SITE_OTHER): Payer: BC Managed Care – PPO

## 2020-07-13 DIAGNOSIS — J309 Allergic rhinitis, unspecified: Secondary | ICD-10-CM | POA: Diagnosis not present

## 2020-09-07 ENCOUNTER — Ambulatory Visit
Admission: RE | Admit: 2020-09-07 | Discharge: 2020-09-07 | Disposition: A | Discharge status: Home / Self Care | Payer: BC Managed Care – PPO | Source: Ambulatory Visit | Attending: Nurse Practitioner | Admitting: Nurse Practitioner

## 2020-09-07 ENCOUNTER — Other Ambulatory Visit: Payer: Self-pay | Admitting: Nurse Practitioner

## 2020-09-07 DIAGNOSIS — M5442 Lumbago with sciatica, left side: Secondary | ICD-10-CM

## 2020-09-11 ENCOUNTER — Encounter: Payer: Self-pay | Admitting: Physician Assistant

## 2020-09-11 ENCOUNTER — Ambulatory Visit: Payer: BC Managed Care – PPO | Admitting: Physician Assistant

## 2020-09-11 ENCOUNTER — Telehealth: Payer: Self-pay | Admitting: Orthopaedic Surgery

## 2020-09-11 VITALS — Ht 65.0 in | Wt 302.0 lb

## 2020-09-11 DIAGNOSIS — M5442 Lumbago with sciatica, left side: Secondary | ICD-10-CM

## 2020-09-11 MED ORDER — CYCLOBENZAPRINE HCL 10 MG PO TABS: 10.0000 mg | ORAL_TABLET | Freq: Three times a day (TID) | ORAL | 1 refills | Status: AC | PRN

## 2020-09-11 MED ORDER — HYDROCODONE-ACETAMINOPHEN 5-325 MG PO TABS: 1.0000 | ORAL_TABLET | ORAL | 0 refills | Status: AC | PRN

## 2020-09-11 NOTE — Telephone Encounter (Signed)
Called pt to let her know she has been approved

## 2020-09-11 NOTE — Telephone Encounter (Signed)
Pt called stating she had an appt today and discussed getting set up for an MRI; pt states the imaging office called her and told her they could see her as soon as tomorrow morning if our office sends in authorization. Pt would like to have this done and a CB to update her  [phone]

## 2020-09-11 NOTE — Progress Notes (Signed)
Office Visit Note   Patient: Deborah Garza           Date of Birth: May 16, 1981           MRN: [id number] Visit Date: 09/11/2020              Requested by: Iona Beard, [address] [place],  [place] [address] PCP: Iona Beard, MD   Assessment & Plan: Visit Diagnoses:  1. Acute left-sided low back pain with left-sided sciatica     Plan: Given the fact the patient continues to have low back pain that is radiating down her left leg despite conservative measures which included medications rest and back exercises.  Recommend MRI of lumbar spine to rule out HNP as a source of her left radicular leg pain.  She will follow-up with [place] possibly 5 days after the MRI to go over results and discuss further treatment.  Follow-Up Instructions: Return After MRI.   Orders:  Orders Placed This Encounter  Procedures  . MR Lumbar Spine w/o contrast   Meds ordered this encounter  Medications  . HYDROcodone-acetaminophen (NORCO) 5-325 MG tablet    Sig: Take 1-2 tablets by mouth every 4 (four) hours as needed for moderate pain.    Dispense:  30 tablet    Refill:  0  . cyclobenzaprine (FLEXERIL) 10 MG tablet    Sig: Take 1 tablet (10 mg total) by mouth every 8 (eight) hours as needed for muscle spasms.    Dispense:  30 tablet    Refill:  1      Procedures: No procedures performed   Clinical Data: No additional findings.   Subjective: Chief Complaint  Patient presents with  . Left Hip - Pain  . Lower Back - Pain    HPI Deborah Garza is a 40 year old female were seen for the first time for low back pain and pain down her left leg.  She states she is having numbness tingling down her left leg for over a week.  She had no known injury.  No prior radicular pain.  She is also having low back pain.  She was seen by her primary care physician who placed her on gabapentin and Flexeril.  She is also taken ibuprofen.  She states nothing is helping with the pain.  Pain does awaken her.  No  bowel bladder dysfunction.  No saddle anesthesia like symptoms.  She notes she has had back pain for weeks and that the radicular symptoms just began this past Tuesday.  She is diabetic reports good control of her diabetes. Radiographs dated 09/07/2020 are reviewed.  Lumbar spine AP lateral and obliques shows no acute fractures.  Slight retrolisthesis L4 on L5.  Moderate degenerative disc disease at L4-5.  Otherwise this space well-maintained. AP and lateral views left hip: Shows no acute fracture.  Left hip is well located.  Hip joints well-maintained.   Review of Systems Please see HPI otherwise negative or noncontributory.  Objective: Vital Signs: Ht 5\' 5"  (1.651 m)   Wt (!) 302 lb (137 kg)   LMP 08/03/2018 (Exact Date) Comment: Spotting  BMI 50.26 kg/m   Physical Exam General: Well-developed well-nourished female no acute distress mood affect appropriate. Psych: Oriented x3. Ortho Exam Lower extremities she has 5 out of 5 strength throughout the lower extremities against resistance.  Positive straight leg raise on the left negative on the right.  Sensation grossly intact bilateral feet to light touch.  Dorsal pedal pulses are  2+ bilaterally. Specialty Comments:  No specialty comments available.  Imaging: No results found.   PMFS History: Patient Active Problem List   Diagnosis Date Noted  . Perennial and seasonal allergic rhinitis 10/04/2019  . Allergic conjunctivitis 10/04/2019  . 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 Coughing 10/04/2019  . Leiomyoma of uterus, unspecified 09/08/2018  . Fibroid, uterine 09/08/2018  . Obesity 12/04/2011  . Contraception management-Mirena IUD 12/04/2011   Past Medical History:  Diagnosis Date  . Anemia    low iron  . Anxiety   . Eczema   . History of kidney stones   . Hypertension     History reviewed. No pertinent family history.  Past Surgical History:  Procedure Laterality Date  . ABDOMINAL HYSTERECTOMY Bilateral 09/08/2018    Procedure: HYSTERECTOMY ABDOMINAL with Bilateral Salpingectomy;  Surgeon: Everett Graff, MD;  Location: [facility];  Service: Gynecology;  Laterality: Bilateral;  . BREAST RECONSTRUCTION Bilateral 01/27/2020   Procedure: BILATERAL BREAST RECONSTRUCTION;  Surgeon: Youlanda Roys, MD;  Location: [place];  Service: Plastics;  Laterality: Bilateral;  . CESAREAN SECTION    . CYSTOSCOPY  09/08/2018   Procedure: CYSTOSCOPY;  Surgeon: Everett Graff, MD;  Location: [facility];  Service: Gynecology;;  . Murrell Redden & CURRETTAGE/HYSTROSCOPY WITH HYDROTHERMAL ABLATION N/A 08/28/2018   Procedure: DILATATION & CURETTAGE/HYSTEROSCOPY, Attempted Hydrothermal Ablation, Attempted Novasure;  Surgeon: Everett Graff, MD;  Location: [facility];  Service: Gynecology;  Laterality: N/A;  . ear tubes    . LAPAROSCOPIC TUBAL LIGATION Bilateral 08/28/2018   Procedure: Attempted LAPAROSCOPIC TUBAL LIGATION;  Surgeon: Everett Graff, MD;  Location: [facility];  Service: Gynecology;  Laterality: Bilateral;  . TONSILLECTOMY     Social History   Occupational History  . Not on file  Tobacco Use  . Smoking status: Never Smoker  . Smokeless tobacco: Never Used  Vaping Use  . Vaping Use: Never used  Substance and Sexual Activity  . Alcohol use: No  . Drug use: No  . Sexual activity: Yes    Birth control/protection: I.U.D.

## 2020-09-12 ENCOUNTER — Ambulatory Visit
Admission: RE | Admit: 2020-09-12 | Discharge: 2020-09-12 | Disposition: A | Discharge status: Home / Self Care | Payer: BC Managed Care – PPO | Source: Ambulatory Visit | Attending: Physician Assistant | Admitting: Physician Assistant

## 2020-09-12 ENCOUNTER — Other Ambulatory Visit: Payer: Self-pay

## 2020-09-12 ENCOUNTER — Telehealth: Payer: Self-pay | Admitting: Physical Medicine and Rehabilitation

## 2020-09-12 DIAGNOSIS — M5442 Lumbago with sciatica, left side: Secondary | ICD-10-CM

## 2020-09-12 NOTE — Addendum Note (Signed)
Addended by: Robyne Peers on: 09/12/2020 08:31 AM   Modules accepted: Orders

## 2020-09-12 NOTE — Telephone Encounter (Signed)
Pt called stating she has a referral and would like to get scheduled as soon as possible for an inj.   [phone]

## 2020-09-13 ENCOUNTER — Telehealth: Payer: Self-pay | Admitting: Physician Assistant

## 2020-09-13 NOTE — Telephone Encounter (Signed)
Pt called stating she had a referral for Dr. Ernestina Patches for an injection but he won't be able to get her in until 09/25/20 and she doesn't feel like she can wait that long. Pt states Artis Delay mentioned having the injection done at [facility] and she would like to see if that would be a quicker route. Pt would like a CB to update her please  [phone]

## 2020-09-13 NOTE — Addendum Note (Signed)
Addended by: Robyne Peers on: 09/13/2020 01:22 PM   Modules accepted: Orders

## 2020-09-13 NOTE — Telephone Encounter (Signed)
Called [facility] with DRi to see if can get pt in sooner, pending call back from pt.

## 2020-09-13 NOTE — Telephone Encounter (Signed)
Pt is scheduled for ESI this Friday at [facility]

## 2020-09-15 ENCOUNTER — Other Ambulatory Visit: Payer: Self-pay | Admitting: Physician Assistant

## 2020-09-15 ENCOUNTER — Ambulatory Visit
Admission: RE | Admit: 2020-09-15 | Discharge: 2020-09-15 | Disposition: A | Discharge status: Home / Self Care | Payer: BC Managed Care – PPO | Source: Ambulatory Visit | Attending: Physician Assistant | Admitting: Physician Assistant

## 2020-09-15 ENCOUNTER — Other Ambulatory Visit: Payer: Self-pay

## 2020-09-15 DIAGNOSIS — M5442 Lumbago with sciatica, left side: Secondary | ICD-10-CM

## 2020-09-15 MED ORDER — METHYLPREDNISOLONE ACETATE 40 MG/ML INJ SUSP (RADIOLOG
120.0000 mg | Freq: Once | INTRAMUSCULAR | Status: AC
  Administered 2020-09-15: 120 mg via EPIDURAL

## 2020-09-15 MED ORDER — IOPAMIDOL (ISOVUE-M 200) INJECTION 41%
1.0000 mL | Freq: Once | INTRAMUSCULAR | Status: AC
  Administered 2020-09-15: 1 mL via EPIDURAL

## 2020-09-15 NOTE — Discharge Instructions (Signed)

## 2020-11-22 ENCOUNTER — Other Ambulatory Visit: Payer: Self-pay | Admitting: Family Medicine

## 2020-11-22 ENCOUNTER — Telehealth: Payer: Self-pay | Admitting: Family Medicine

## 2020-11-22 MED ORDER — LEVOCETIRIZINE DIHYDROCHLORIDE 5 MG PO TABS: 5.0000 mg | ORAL_TABLET | Freq: Every evening | ORAL | 1 refills | Status: DC

## 2020-11-22 NOTE — Telephone Encounter (Signed)
Patient requested a refill on levocetirizine sent to [facility] in [place] on [address]. Patient states she called her pharmacy and they do not have an more refills. Informed patient she needs an OV and she could not make an appointment at this time due to her schedule.  Please advise.

## 2020-11-22 NOTE — Telephone Encounter (Signed)
Refill for levocetirizine x 1 with 1 refill at [facility]

## 2021-02-06 ENCOUNTER — Telehealth: Payer: Self-pay | Admitting: Physical Medicine and Rehabilitation

## 2021-02-06 DIAGNOSIS — M5442 Lumbago with sciatica, left side: Secondary | ICD-10-CM

## 2021-02-06 NOTE — Telephone Encounter (Signed)
Pt's mother Simmie Davies called requesting a call back to set an appt for pt. Pt needs to set appt for back injection. Please call at [phone].

## 2021-02-08 NOTE — Telephone Encounter (Signed)
I spoke with the patient's mother. She is requesting a referral to [facility] for a repeat injection. The last was ordered by Artis Delay. Please advise.

## 2021-02-08 NOTE — Telephone Encounter (Signed)
New order was placed in chart

## 2021-02-08 NOTE — Telephone Encounter (Signed)
Faxed referral to [facility] with [facility]

## 2021-02-09 ENCOUNTER — Other Ambulatory Visit: Payer: Self-pay | Admitting: Physician Assistant

## 2021-02-09 DIAGNOSIS — M5442 Lumbago with sciatica, left side: Secondary | ICD-10-CM

## 2021-02-16 ENCOUNTER — Other Ambulatory Visit: Payer: Self-pay | Admitting: Physician Assistant

## 2021-02-16 ENCOUNTER — Ambulatory Visit
Admission: RE | Admit: 2021-02-16 | Discharge: 2021-02-16 | Disposition: A | Discharge status: Home / Self Care | Payer: BC Managed Care – PPO | Source: Ambulatory Visit | Attending: Physician Assistant | Admitting: Physician Assistant

## 2021-02-16 ENCOUNTER — Other Ambulatory Visit: Payer: Self-pay

## 2021-02-16 DIAGNOSIS — M5442 Lumbago with sciatica, left side: Secondary | ICD-10-CM

## 2021-02-16 MED ORDER — IOPAMIDOL (ISOVUE-M 200) INJECTION 41%
1.0000 mL | Freq: Once | INTRAMUSCULAR | Status: AC
  Administered 2021-02-16: 1 mL via EPIDURAL

## 2021-02-16 MED ORDER — METHYLPREDNISOLONE ACETATE 40 MG/ML INJ SUSP (RADIOLOG
80.0000 mg | Freq: Once | INTRAMUSCULAR | Status: AC
  Administered 2021-02-16: 80 mg via EPIDURAL

## 2021-02-16 NOTE — Discharge Instructions (Signed)

## 2021-05-17 ENCOUNTER — Ambulatory Visit
Admission: RE | Admit: 2021-05-17 | Discharge: 2021-05-17 | Disposition: A | Discharge status: Home / Self Care | Payer: No Typology Code available for payment source | Source: Ambulatory Visit | Attending: Family Medicine | Admitting: Family Medicine

## 2021-05-17 ENCOUNTER — Other Ambulatory Visit: Payer: Self-pay | Admitting: Family Medicine

## 2021-05-17 DIAGNOSIS — M25561 Pain in right knee: Secondary | ICD-10-CM

## 2021-05-17 DIAGNOSIS — S8991XA Unspecified injury of right lower leg, initial encounter: Secondary | ICD-10-CM

## 2021-11-29 IMAGING — CR DG KNEE COMPLETE 4+V*R*
4 series · 4 of 4 positions shown · non-contrast
Comparison: None.

CLINICAL DATA: Pain post fall

EXAM:
RIGHT KNEE - COMPLETE 4+ VIEW

[t knee ap right]
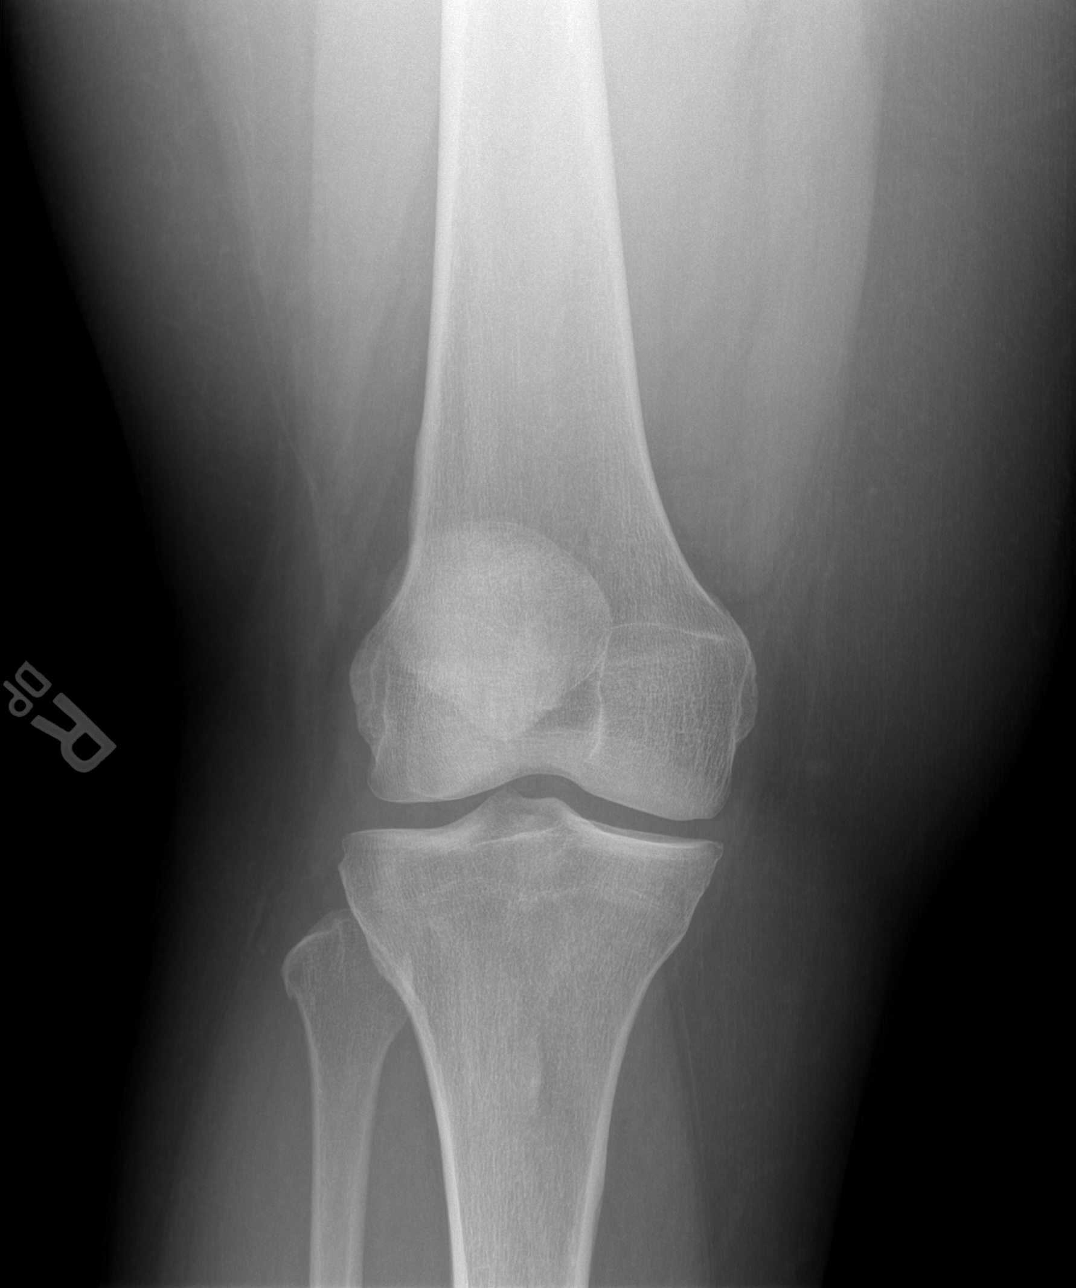

[t knee oblique right (1 of 2)]
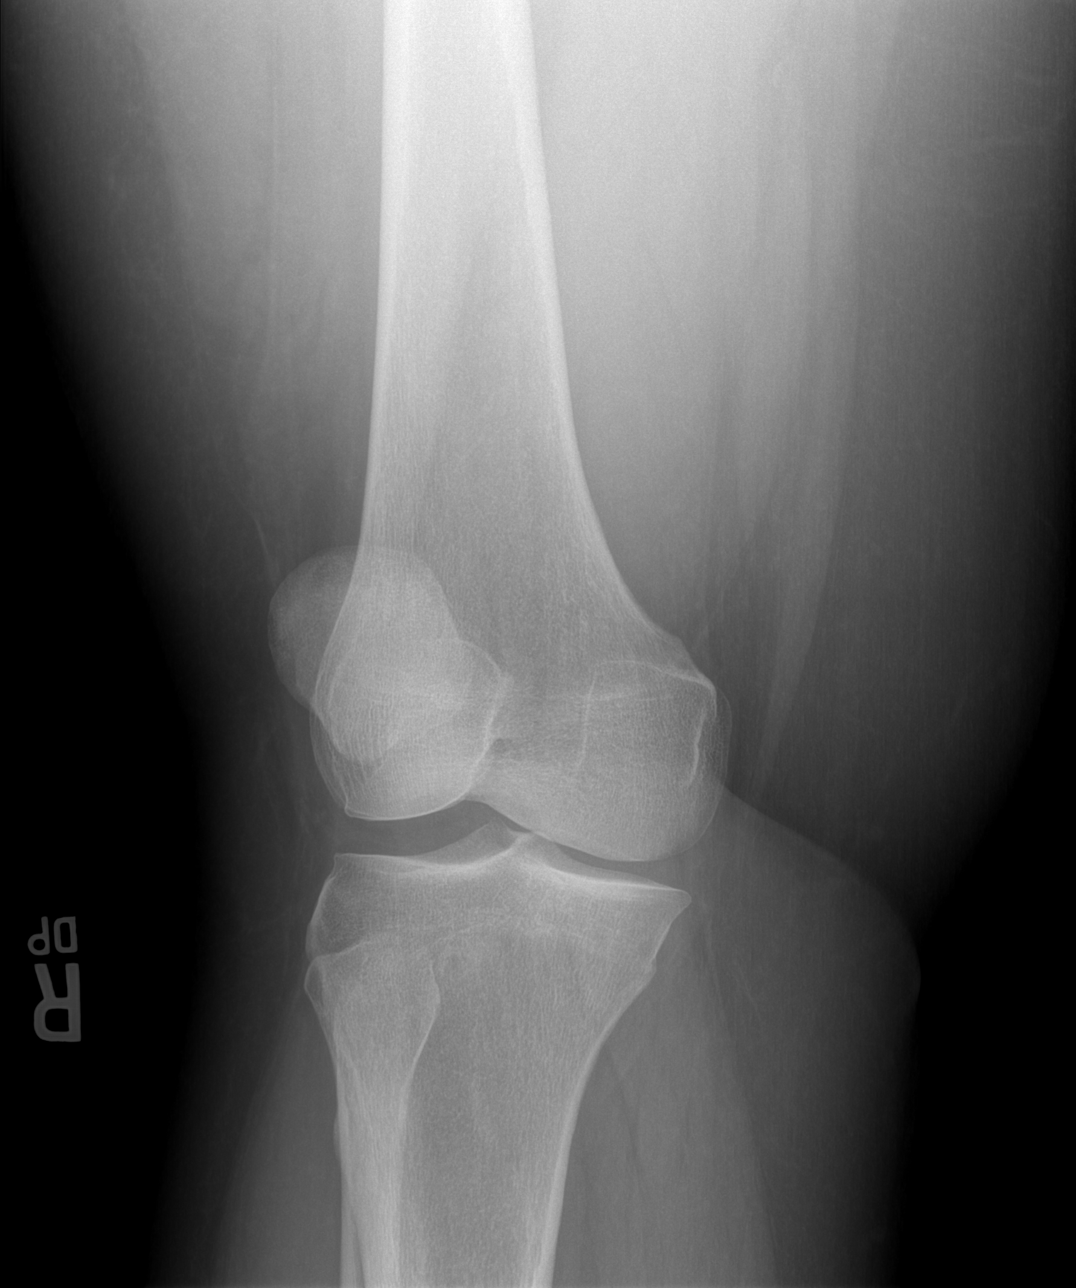

[t knee oblique right (2 of 2)]
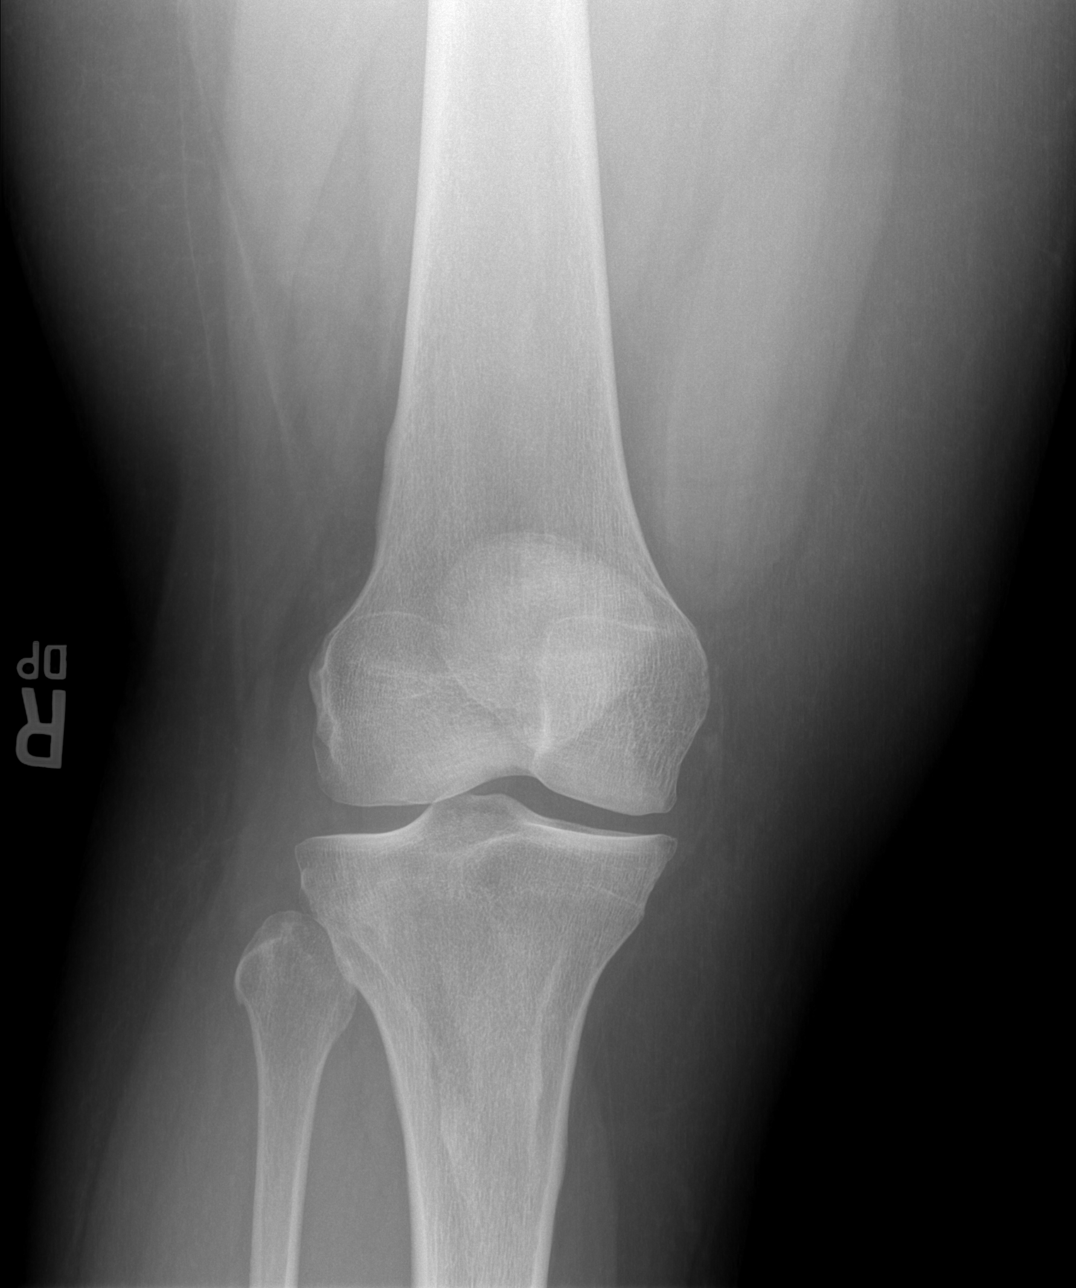

[t knee lat right]
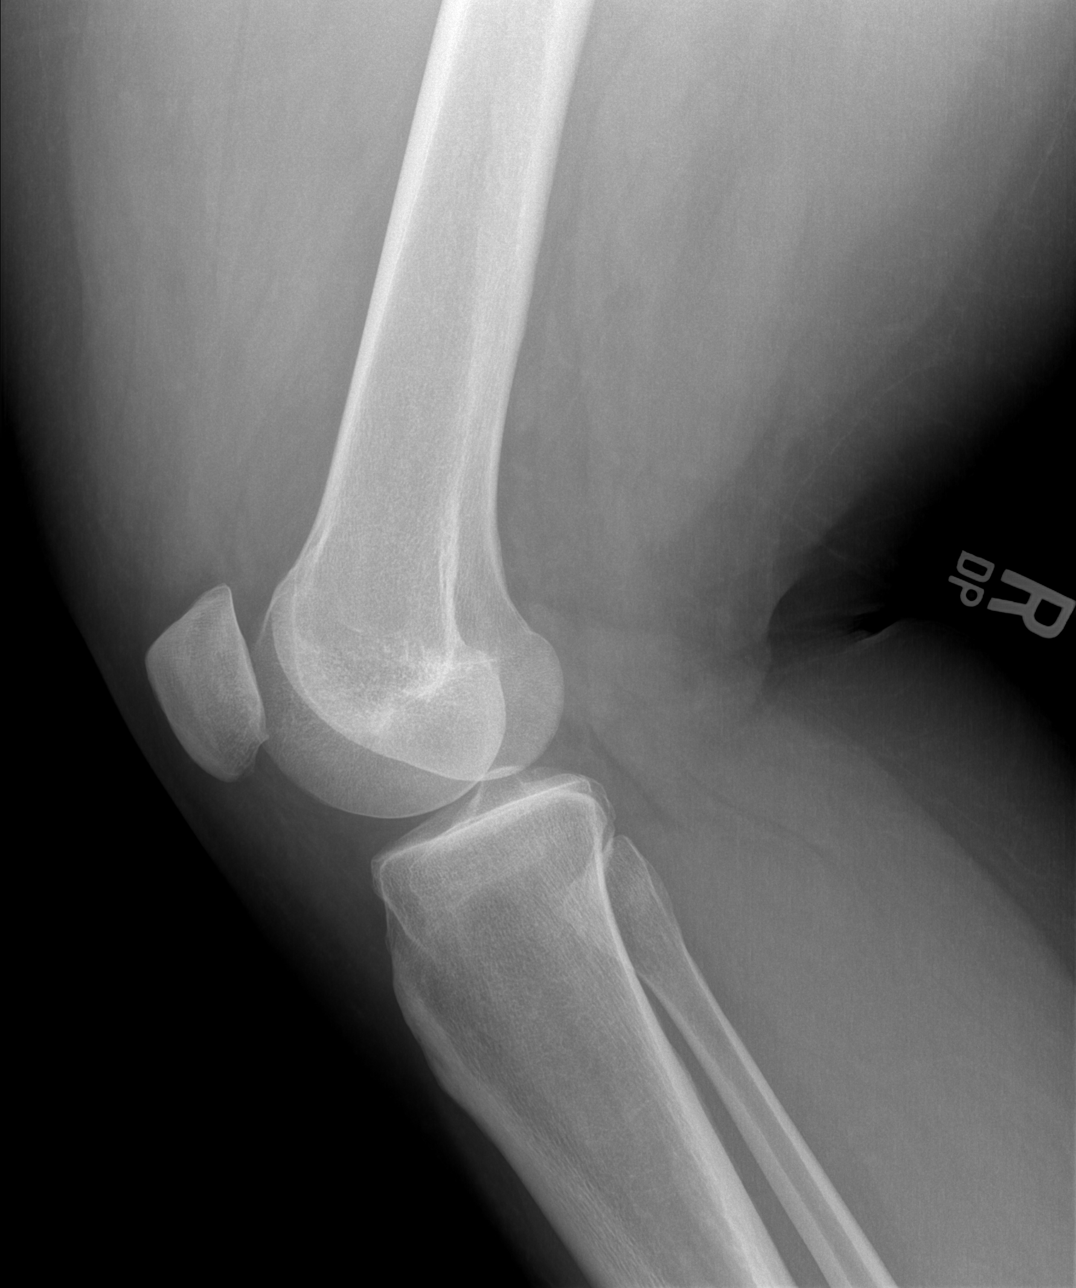

[4 of 4 positions shown; findings below may reference images not displayed]

FINDINGS: No evidence of fracture, dislocation, or joint effusion. No evidence
of arthropathy or other focal bone abnormality. Soft tissues are
unremarkable.
IMPRESSION: Negative.

## 2022-03-01 ENCOUNTER — Other Ambulatory Visit: Payer: Self-pay | Admitting: Obstetrics and Gynecology

## 2022-11-30 ENCOUNTER — Encounter (HOSPITAL_BASED_OUTPATIENT_CLINIC_OR_DEPARTMENT_OTHER): Payer: Self-pay | Admitting: Emergency Medicine

## 2022-11-30 ENCOUNTER — Other Ambulatory Visit: Payer: Self-pay

## 2022-11-30 ENCOUNTER — Emergency Department (HOSPITAL_BASED_OUTPATIENT_CLINIC_OR_DEPARTMENT_OTHER)
Admission: EM | Admit: 2022-11-30 | Discharge: 2022-11-30 | Disposition: A | Discharge status: Home / Self Care | Payer: BC Managed Care – PPO | Attending: Emergency Medicine | Admitting: Emergency Medicine

## 2022-11-30 DIAGNOSIS — J029 Acute pharyngitis, unspecified: Secondary | ICD-10-CM | POA: Diagnosis present

## 2022-11-30 DIAGNOSIS — J020 Streptococcal pharyngitis: Secondary | ICD-10-CM | POA: Diagnosis not present

## 2022-11-30 LAB — GROUP A STREP BY PCR: Group A Strep by PCR: DETECTED — AB

## 2022-11-30 MED ORDER — AMOXICILLIN 500 MG PO CAPS
1000.0000 mg | ORAL_CAPSULE | Freq: Once | ORAL | Status: AC
  Administered 2022-11-30: 1000 mg via ORAL
  Filled 2022-11-30: qty 2

## 2022-11-30 MED ORDER — KETOROLAC TROMETHAMINE 15 MG/ML IJ SOLN
15.0000 mg | Freq: Once | INTRAMUSCULAR | Status: AC
  Administered 2022-11-30: 15 mg via INTRAMUSCULAR
  Filled 2022-11-30: qty 1

## 2022-11-30 MED ORDER — AMOXICILLIN 500 MG PO CAPS: 1000.0000 mg | ORAL_CAPSULE | Freq: Two times a day (BID) | ORAL | 0 refills | Status: DC

## 2022-11-30 MED ORDER — DEXAMETHASONE 4 MG PO TABS
6.0000 mg | ORAL_TABLET | Freq: Once | ORAL | Status: AC
  Administered 2022-11-30: 6 mg via ORAL
  Filled 2022-11-30: qty 2

## 2022-11-30 MED ORDER — ACETAMINOPHEN 325 MG PO TABS
650.0000 mg | ORAL_TABLET | Freq: Once | ORAL | Status: AC | PRN
  Administered 2022-11-30: 650 mg via ORAL
  Filled 2022-11-30: qty 2

## 2022-11-30 MED ORDER — AMOXICILLIN 500 MG PO CAPS: 1000.0000 mg | ORAL_CAPSULE | Freq: Two times a day (BID) | ORAL | 0 refills | Status: AC

## 2022-11-30 NOTE — ED Triage Notes (Signed)
Pt presents to ED POV. Pt c/o sore throat, cold chills, and HA since last night. Denies sick contacts. Ibuprofen ~1h

## 2022-11-30 NOTE — ED Provider Notes (Signed)
Deborah Garza AT [facility] Provider Note   CSN: [id number] Arrival date & time: 11/30/22  1556     History Chief Complaint  Patient presents with   Sore Throat   Headache    HPI Deborah Garza is a 42 y.o. female presenting for chief complaint of sore throat.  States that her symptoms started approximately yesterday morning.  She has had a sore throat primarily fever today denies any cough or congestion.  Otherwise ambulatory tolerating p.o. intake.  Mild headache though no meningismus or neck stiffness.  Minimal medical history otherwise.   Patient's recorded medical, surgical, social, medication list and allergies were reviewed in the Snapshot window as part of the initial history.   Review of Systems   Review of Systems  Constitutional:  Positive for fever. Negative for chills.  HENT:  Positive for sore throat. Negative for ear pain.   Eyes:  Negative for pain and visual disturbance.  Respiratory:  Negative for cough and shortness of breath.   Cardiovascular:  Negative for chest pain and palpitations.  Gastrointestinal:  Negative for abdominal pain and vomiting.  Genitourinary:  Negative for dysuria and hematuria.  Musculoskeletal:  Negative for arthralgias and back pain.  Skin:  Negative for color change and rash.  Neurological:  Negative for seizures and syncope.  All other systems reviewed and are negative.   Physical Exam Updated Vital Signs BP 136/88 (BP Location: Right Arm)   Pulse (!) 101   Temp (!) 101.7 F (38.7 C) (Oral)   Resp 18   LMP 08/03/2018 (Exact Date) Comment: Spotting  SpO2 98%  Physical Exam Vitals and nursing note reviewed.  Constitutional:      General: She is not in acute distress.    Appearance: She is well-developed.  HENT:     Head: Normocephalic and atraumatic.     Right Ear: Tympanic membrane and ear canal normal.     Left Ear: Tympanic membrane and ear canal normal.     Mouth/Throat:     Mouth: Oral  lesions present.     Pharynx: Posterior oropharyngeal erythema present. No oropharyngeal exudate.  Eyes:     Conjunctiva/sclera: Conjunctivae normal.  Cardiovascular:     Rate and Rhythm: Normal rate and regular rhythm.     Heart sounds: No murmur heard. Pulmonary:     Effort: Pulmonary effort is normal. No respiratory distress.     Breath sounds: Normal breath sounds.  Abdominal:     General: There is no distension.     Palpations: Abdomen is soft.     Tenderness: There is no abdominal tenderness. There is no right CVA tenderness or left CVA tenderness.  Musculoskeletal:        General: No swelling or tenderness. Normal range of motion.     Cervical back: Neck supple.  Skin:    General: Skin is warm and dry.  Neurological:     General: No focal deficit present.     Mental Status: She is alert and oriented to person, place, and time. Mental status is at baseline.     Cranial Nerves: No cranial nerve deficit.      ED Course/ Medical Decision Making/ A&P    Procedures Procedures   Medications Ordered in ED Medications  amoxicillin (AMOXIL) capsule 1,000 mg (has no administration in time range)  ketorolac (TORADOL) 15 MG/ML injection 15 mg (has no administration in time range)  dexamethasone (DECADRON) tablet 6 mg (has no administration in time range)  acetaminophen (TYLENOL) tablet 650 mg (650 mg Oral Given 11/30/22 1617)   Medical Decision Making:   Deborah Garza is a 42 y.o. female who presented to the ED today with subjective fever, sore throat detailed above.    Complete initial physical exam performed, notably the patient  was hemodynamically stable in no acute distress.  Posterior oropharynx illuminated and without obvious swelling or deformity.  Patient is without neck stiffness.    Reviewed and confirmed nursing documentation for past medical history, family history, social history.    Initial Assessment:   With the patient's presentation of fever cough congestion,  most likely diagnosis is developing viral upper respiratory infection. Other diagnoses were considered including (but not limited to) peritonsillar abscess, retropharyngeal abscess, pneumonia. These are considered less likely due to history of present illness and physical exam findings.   This is most consistent with an acute life/limb threatening illness complicated by underlying chronic conditions. Considered meningitis, however patient's symptoms, vital signs, physical exam findings including lack of meningismus seem grossly less consistent at this time. Initial Plan:  Empiric treatment with antipyretics including acetaminophen in ambulatory setting Will augment with dose of ketorolac in ED  As patient has sore throat, CENTOR Score dictates the following evaluation: RST which is positive and will be treated with amoxil Additionally will treat sore throat with dexamethasone dose Objective evaluation as below reviewed   Initial Study Results:   Laboratory  All laboratory results reviewed without evidence of clinically relevant pathology.   Exceptions include:  +Strep   Final Assessment and Plan:   On reassessment, patient is ambulatory tolerating p.o. intake in no acute distress.   Patient is currently stable for outpatient care and management with no indication for hospitalization or transfer at this time.  Discussed all findings with patient expressed understanding.  Disposition:  Based on the above findings, I believe patient is stable for discharge.    Patient/family educated about specific return precautions for given chief complaint and symptoms.  Patient/family educated about follow-up with PCP.     Patient/family expressed understanding of return precautions and need for follow-up. Patient spoken to regarding all imaging and laboratory results and appropriate follow up for these results. All education provided in verbal form with additional information in written form. Time was  allowed for answering of patient questions. Patient discharged.    Emergency Garza Medication Summary:   Medications  amoxicillin (AMOXIL) capsule 1,000 mg (has no administration in time range)  ketorolac (TORADOL) 15 MG/ML injection 15 mg (has no administration in time range)  dexamethasone (DECADRON) tablet 6 mg (has no administration in time range)  acetaminophen (TYLENOL) tablet 650 mg (650 mg Oral Given 11/30/22 1617)           Clinical Impression:  1. Sore throat   2. Strep pharyngitis      Data Unavailable    Final Clinical Impression(s) / ED Diagnoses Final diagnoses:  Sore throat  Strep pharyngitis    Rx / DC Orders ED Discharge Orders          Ordered    amoxicillin (AMOXIL) 500 MG capsule  2 times daily,   Status:  Discontinued        11/30/22 1702    amoxicillin (AMOXIL) 500 MG capsule  2 times daily        11/30/22 1702              Glyn Ade, MD 11/30/22 1703

## 2023-11-05 ENCOUNTER — Other Ambulatory Visit: Payer: Self-pay

## 2023-11-05 ENCOUNTER — Emergency Department (HOSPITAL_BASED_OUTPATIENT_CLINIC_OR_DEPARTMENT_OTHER)
Admission: EM | Admit: 2023-11-05 | Discharge: 2023-11-05 | Disposition: A | Discharge status: Home / Self Care | Attending: Emergency Medicine | Admitting: Emergency Medicine

## 2023-11-05 ENCOUNTER — Encounter (HOSPITAL_BASED_OUTPATIENT_CLINIC_OR_DEPARTMENT_OTHER): Payer: Self-pay

## 2023-11-05 ENCOUNTER — Other Ambulatory Visit (HOSPITAL_BASED_OUTPATIENT_CLINIC_OR_DEPARTMENT_OTHER): Payer: Self-pay

## 2023-11-05 DIAGNOSIS — J029 Acute pharyngitis, unspecified: Secondary | ICD-10-CM | POA: Diagnosis present

## 2023-11-05 LAB — RESP PANEL BY RT-PCR (RSV, FLU A&B, COVID)  RVPGX2
Influenza A by PCR: NEGATIVE
Influenza B by PCR: NEGATIVE
Resp Syncytial Virus by PCR: NEGATIVE
SARS Coronavirus 2 by RT PCR: NEGATIVE

## 2023-11-05 LAB — GROUP A STREP BY PCR: Group A Strep by PCR: DETECTED — AB

## 2023-11-05 MED ORDER — DEXAMETHASONE 4 MG PO TABS
10.0000 mg | ORAL_TABLET | Freq: Once | ORAL | Status: AC
  Administered 2023-11-05: 10 mg via ORAL
  Filled 2023-11-05: qty 3

## 2023-11-05 MED ORDER — AMOXICILLIN 500 MG PO CAPS
1000.0000 mg | ORAL_CAPSULE | Freq: Every day | ORAL | 0 refills | Status: AC
  Filled 2023-11-05: qty 20, 10d supply, fill #0

## 2023-11-05 NOTE — ED Notes (Signed)
 Dc instructions reviewed with patient. Patient voiced understanding. Dc with belongings.

## 2023-11-05 NOTE — ED Provider Notes (Signed)
 [place] EMERGENCY DEPARTMENT AT [facility] Provider Note   CSN: [id number] Arrival date & time: 11/05/23  4098     History  Chief Complaint  Patient presents with   Sore Throat    Deborah Garza is a 43 y.o. female.  Patient here with sore throat started yesterday.  Her son has strep throat.  Normal vitals.  No fever.  No difficulty swallowing.  No cough no sputum production no fever.  Nothing makes it worse or better.  No major change in her voice.  The history is provided by the patient.       Home Medications Prior to Admission medications   Medication Sig Start Date End Date Taking? Authorizing Provider  amoxicillin (AMOXIL) 500 MG capsule Take 2 capsules (1,000 mg total) by mouth daily for 10 days. 11/05/23 11/15/23 Yes Angelus Hoopes, DO  acetaminophen (TYLENOL) 500 MG tablet Take 1,000 mg by mouth every 6 (six) hours as needed for moderate pain or headache.    [provider]  atorvastatin (LIPITOR) 10 MG tablet Take 10 mg by mouth at bedtime. 03/22/19   [provider]  cyclobenzaprine (FLEXERIL) 10 MG tablet Take 1 tablet (10 mg total) by mouth every 8 (eight) hours as needed for muscle spasms. 09/11/20   Kirtland Bouchard, PA-C  EPINEPHrine 0.3 mg/0.3 mL IJ SOAJ injection Inject 0.3 mLs (0.3 mg total) into the muscle once as needed for up to 1 dose for anaphylaxis. 11/01/19   Bobbitt, Heywood Iles, MD  Fluticasone Propionate Timmothy Sours) 93 MCG/ACT EXHU Place 2 sprays into the nose 2 (two) times daily as needed. Patient taking differently: Place 2 sprays into the nose 2 (two) times daily as needed (allergies).  01/03/20   Bobbitt, Heywood Iles, MD  hydrochlorothiazide (HYDRODIURIL) 25 MG tablet Take 25 mg by mouth at bedtime.     [provider]  HYDROcodone-acetaminophen (NORCO) 5-325 MG tablet Take 1-2 tablets by mouth every 4 (four) hours as needed for moderate pain. 09/11/20   Kirtland Bouchard, PA-C  ibuprofen (ADVIL) 800 MG tablet Take 800  mg by mouth 2 (two) times daily as needed for moderate pain.     [provider]  levocetirizine (XYZAL) 5 MG tablet TAKE 1 TABLET(5 MG) BY MOUTH EVERY EVENING 11/22/20   Ambs, Norvel Richards, FNP  metFORMIN (GLUCOPHAGE-XR) 500 MG 24 hr tablet Take 500 mg by mouth at bedtime.  03/22/19   [provider]  NIFEdipine (ADALAT CC) 30 MG 24 hr tablet Take 30 mg by mouth at bedtime.  08/13/18   [provider]  Olopatadine HCl (PATADAY) 0.2 % SOLN Place 1 drop into both eyes 1 day or 1 dose. Patient not taking: Reported on 01/18/2020 10/04/19   Bobbitt, Heywood Iles, MD  PARoxetine (PAXIL) 10 MG tablet Take 10 mg by mouth at bedtime.  08/30/15   [provider]  SAXENDA 18 MG/3ML SOPN Inject 3 mg into the skin daily. 12/17/19   [provider]      Allergies    Aspirin    Review of Systems   Review of Systems  Physical Exam Updated Vital Signs BP 125/88 (BP Location: Right Arm)   Pulse 88   Temp 98.2 F (36.8 C) (Oral)   Resp 17   Ht 5\' 5"  (1.651 m)   Wt 122.5 kg   LMP 08/03/2018 (Exact Date) Comment: Spotting  SpO2 99%   BMI 44.93 kg/m  Physical Exam Vitals and nursing note reviewed.  Constitutional:  General: She is not in acute distress.    Appearance: She is well-developed.  HENT:     Head: Normocephalic and atraumatic.     Right Ear: Tympanic membrane normal.     Left Ear: Tympanic membrane normal.     Nose: No congestion.     Mouth/Throat:     Pharynx: Posterior oropharyngeal erythema present. No oropharyngeal exudate or uvula swelling.     Tonsils: No tonsillar exudate or tonsillar abscesses.  Eyes:     Conjunctiva/sclera: Conjunctivae normal.  Cardiovascular:     Rate and Rhythm: Normal rate and regular rhythm.     Heart sounds: No murmur heard. Pulmonary:     Effort: Pulmonary effort is normal. No respiratory distress.     Breath sounds: Normal breath sounds.  Abdominal:     Palpations: Abdomen is soft.     Tenderness: There is no  abdominal tenderness.  Musculoskeletal:        General: No swelling.     Cervical back: Neck supple.  Skin:    General: Skin is warm and dry.     Capillary Refill: Capillary refill takes less than 2 seconds.  Neurological:     Mental Status: She is alert.  Psychiatric:        Mood and Affect: Mood normal.     ED Results / Procedures / Treatments   Labs (all labs ordered are listed, but only abnormal results are displayed) Labs Reviewed  GROUP A STREP BY PCR  RESP PANEL BY RT-PCR (RSV, FLU A&B, COVID)  RVPGX2    EKG None  Radiology No results found.  Procedures Procedures    Medications Ordered in ED Medications  dexamethasone (DECADRON) tablet 10 mg (has no administration in time range)    ED Course/ Medical Decision Making/ A&P                                 Medical Decision Making Risk Prescription drug management.   Deborah Garza is here with sore throat.  Exposure to strep.  Unremarkable vitals.  No fever.  She has no trismus no drooling no submandibular swelling.  Little bit of erythema to the back of the throat.  Differential diagnosis likely viral process versus allergy process versus strep pharyngitis.  Given her close contact with strep we will empirically treat with amoxicillin and will treat Decadron.  I have no concern for abscess or other deep space infectious process.  She understands return precautions.  Discharged in good condition.  This chart was dictated using voice recognition software.  Despite best efforts to proofread,  errors can occur which can change the documentation meaning.         Final Clinical Impression(s) / ED Diagnoses Final diagnoses:  Sore throat    Rx / DC Orders ED Discharge Orders          Ordered    amoxicillin (AMOXIL) 500 MG capsule  Daily        11/05/23 0839              Virgina Norfolk, DO 11/05/23 [phone]

## 2023-11-05 NOTE — ED Triage Notes (Signed)
 In for eval of sore throat onset yesterday. Has been taking care of her son that is positive for strep.

## 2023-11-05 NOTE — Discharge Instructions (Signed)
 I am treating you for strep pharyngitis.  He can follow-up for strep test, COVID flu RSV testing MyChart.  I treated you with a long-acting steroid that should help regardless of what is causing your sore throat.  Take antibiotics as prescribed.  Return if symptoms worsen as we discussed.

## 2023-12-26 ENCOUNTER — Other Ambulatory Visit: Payer: Self-pay

## 2023-12-26 ENCOUNTER — Emergency Department (HOSPITAL_BASED_OUTPATIENT_CLINIC_OR_DEPARTMENT_OTHER)
Admission: EM | Admit: 2023-12-26 | Discharge: 2023-12-26 | Disposition: A | Discharge status: Home / Self Care | Attending: Emergency Medicine | Admitting: Emergency Medicine

## 2023-12-26 ENCOUNTER — Encounter (HOSPITAL_BASED_OUTPATIENT_CLINIC_OR_DEPARTMENT_OTHER): Payer: Self-pay

## 2023-12-26 DIAGNOSIS — M25512 Pain in left shoulder: Secondary | ICD-10-CM | POA: Diagnosis not present

## 2023-12-26 DIAGNOSIS — R0981 Nasal congestion: Secondary | ICD-10-CM | POA: Diagnosis present

## 2023-12-26 DIAGNOSIS — B349 Viral infection, unspecified: Secondary | ICD-10-CM | POA: Insufficient documentation

## 2023-12-26 LAB — RESP PANEL BY RT-PCR (RSV, FLU A&B, COVID)  RVPGX2
Influenza A by PCR: NEGATIVE
Influenza B by PCR: NEGATIVE
Resp Syncytial Virus by PCR: NEGATIVE
SARS Coronavirus 2 by RT PCR: NEGATIVE

## 2023-12-26 LAB — GROUP A STREP BY PCR: Group A Strep by PCR: NOT DETECTED

## 2023-12-26 MED ORDER — IBUPROFEN 400 MG PO TABS
600.0000 mg | ORAL_TABLET | Freq: Once | ORAL | Status: AC
  Administered 2023-12-26: 600 mg via ORAL
  Filled 2023-12-26: qty 1

## 2023-12-26 NOTE — ED Triage Notes (Addendum)
 Arrives POV with complaints of sinus congestion, sore throat (hx of strep), and left arm pain x2 days. Did not have a fall or recent injury to her arm. Rates pain an 8/10.

## 2023-12-26 NOTE — ED Provider Notes (Signed)
 [place] EMERGENCY DEPARTMENT AT [facility] Provider Note   CSN: [id number] Arrival date & time: 12/26/23  0272     History  Chief Complaint  Patient presents with   Arm Pain    left   Sinus Problem   Sore Throat    Deborah Garza is a 43 y.o. female.  43 year old female presents with 48 hours of URI symptoms consisting of runny nose, sinus congestion, left-sided ear pain.  Denies any fever or chills.  No vomiting or diarrhea.  Denies any sick exposures.  She has also had a sore throat and does have a history of strep and is concerned about that.  No treatment use prior to arrival       Home Medications Prior to Admission medications   Medication Sig Start Date End Date Taking? Authorizing Provider  acetaminophen  (TYLENOL ) 500 MG tablet Take 1,000 mg by mouth every 6 (six) hours as needed for moderate pain or headache.    [provider]  atorvastatin (LIPITOR) 10 MG tablet Take 10 mg by mouth at bedtime. 03/22/19   [provider]  cyclobenzaprine  (FLEXERIL ) 10 MG tablet Take 1 tablet (10 mg total) by mouth every 8 (eight) hours as needed for muscle spasms. 09/11/20   Bronson Canny, PA-C  EPINEPHrine  0.3 mg/0.3 mL IJ SOAJ injection Inject 0.3 mLs (0.3 mg total) into the muscle once as needed for up to 1 dose for anaphylaxis. 11/01/19   Bobbitt, Colen Daunt, MD  Fluticasone Propionate  (XHANCE ) 93 MCG/ACT EXHU Place 2 sprays into the nose 2 (two) times daily as needed. Patient taking differently: Place 2 sprays into the nose 2 (two) times daily as needed (allergies).  01/03/20   Bobbitt, Colen Daunt, MD  hydrochlorothiazide  (HYDRODIURIL ) 25 MG tablet Take 25 mg by mouth at bedtime.     [provider]  HYDROcodone -acetaminophen  (NORCO) 5-325 MG tablet Take 1-2 tablets by mouth every 4 (four) hours as needed for moderate pain. 09/11/20   Bronson Canny, PA-C  ibuprofen  (ADVIL ) 800 MG tablet Take 800 mg by mouth 2 (two) times daily as needed  for moderate pain.     [provider]  levocetirizine (XYZAL ) 5 MG tablet TAKE 1 TABLET(5 MG) BY MOUTH EVERY EVENING 11/22/20   Ambs, Anne M, FNP  metFORMIN (GLUCOPHAGE-XR) 500 MG 24 hr tablet Take 500 mg by mouth at bedtime.  03/22/19   [provider]  NIFEdipine  (ADALAT  CC) 30 MG 24 hr tablet Take 30 mg by mouth at bedtime.  08/13/18   [provider]  Olopatadine  HCl (PATADAY ) 0.2 % SOLN Place 1 drop into both eyes 1 day or 1 dose. Patient not taking: Reported on 01/18/2020 10/04/19   Bobbitt, Colen Daunt, MD  PARoxetine  (PAXIL ) 10 MG tablet Take 10 mg by mouth at bedtime.  08/30/15   [provider]  SAXENDA 18 MG/3ML SOPN Inject 3 mg into the skin daily. 12/17/19   [provider]      Allergies    Aspirin    Review of Systems   Review of Systems  All other systems reviewed and are negative.   Physical Exam Updated Vital Signs BP 123/89 (BP Location: Right Arm)   Pulse 94   Temp 98.6 F (37 C) (Oral)   Resp 18   Ht 1.651 m (5\' 5" )   Wt 122.5 kg   LMP 08/03/2018 (Exact Date) Comment: Spotting  SpO2 100%   BMI 44.93 kg/m  Physical Exam Vitals and nursing  note reviewed.  Constitutional:      General: She is not in acute distress.    Appearance: Normal appearance. She is well-developed. She is not toxic-appearing.  HENT:     Head: Normocephalic and atraumatic.  Eyes:     General: Lids are normal.     Conjunctiva/sclera: Conjunctivae normal.     Pupils: Pupils are equal, round, and reactive to light.  Neck:     Thyroid: No thyroid mass.     Trachea: No tracheal deviation.  Cardiovascular:     Rate and Rhythm: Normal rate and regular rhythm.     Heart sounds: Normal heart sounds. No murmur heard.    No gallop.  Pulmonary:     Effort: Pulmonary effort is normal. No respiratory distress.     Breath sounds: Normal breath sounds. No stridor. No decreased breath sounds, wheezing, rhonchi or rales.  Abdominal:     General: There is  no distension.     Palpations: Abdomen is soft.     Tenderness: There is no abdominal tenderness. There is no rebound.  Musculoskeletal:        General: No tenderness. Normal range of motion.       Arms:     Cervical back: Normal range of motion and neck supple.     Comments: Neurovascular status intact at left hand  Skin:    General: Skin is warm and dry.     Findings: No abrasion or rash.  Neurological:     Mental Status: She is alert and oriented to person, place, and time. Mental status is at baseline.     GCS: GCS eye subscore is 4. GCS verbal subscore is 5. GCS motor subscore is 6.     Cranial Nerves: No cranial nerve deficit.     Sensory: No sensory deficit.     Motor: Motor function is intact.  Psychiatric:        Attention and Perception: Attention normal.        Speech: Speech normal.        Behavior: Behavior normal.     ED Results / Procedures / Treatments   Labs (all labs ordered are listed, but only abnormal results are displayed) Labs Reviewed  RESP PANEL BY RT-PCR (RSV, FLU A&B, COVID)  RVPGX2  GROUP A STREP BY PCR    EKG None  Radiology No results found.  Procedures Procedures    Medications Ordered in ED Medications  ibuprofen  (ADVIL ) tablet 600 mg (600 mg Oral Given 12/26/23 0836)    ED Course/ Medical Decision Making/ A&P                                 Medical Decision Making  Strep test, viral panel negative.  Patient does have reproducible pain at her [facility] joint on the left side.  Suspect this is the cause of her symptoms and she was given ibuprofen  and does feel better.  Will discharge home        Final Clinical Impression(s) / ED Diagnoses Final diagnoses:  None    Rx / DC Orders ED Discharge Orders     None         Lind Repine, MD 12/26/23 [phone]

## 2023-12-26 NOTE — ED Notes (Signed)
 DC paperwork given and verbally understood.

## 2024-07-08 ENCOUNTER — Emergency Department (HOSPITAL_BASED_OUTPATIENT_CLINIC_OR_DEPARTMENT_OTHER)
Admission: EM | Admit: 2024-07-08 | Discharge: 2024-07-08 | Disposition: A | Discharge status: Home / Self Care | Attending: Emergency Medicine | Admitting: Emergency Medicine

## 2024-07-08 ENCOUNTER — Emergency Department (HOSPITAL_BASED_OUTPATIENT_CLINIC_OR_DEPARTMENT_OTHER)

## 2024-07-08 ENCOUNTER — Encounter (HOSPITAL_BASED_OUTPATIENT_CLINIC_OR_DEPARTMENT_OTHER): Payer: Self-pay | Admitting: Emergency Medicine

## 2024-07-08 ENCOUNTER — Other Ambulatory Visit: Payer: Self-pay

## 2024-07-08 DIAGNOSIS — Z79899 Other long term (current) drug therapy: Secondary | ICD-10-CM | POA: Insufficient documentation

## 2024-07-08 DIAGNOSIS — I10 Essential (primary) hypertension: Secondary | ICD-10-CM | POA: Diagnosis not present

## 2024-07-08 DIAGNOSIS — D72829 Elevated white blood cell count, unspecified: Secondary | ICD-10-CM | POA: Insufficient documentation

## 2024-07-08 DIAGNOSIS — R519 Headache, unspecified: Secondary | ICD-10-CM | POA: Insufficient documentation

## 2024-07-08 DIAGNOSIS — E876 Hypokalemia: Secondary | ICD-10-CM | POA: Diagnosis not present

## 2024-07-08 LAB — CBC WITH DIFFERENTIAL/PLATELET
Abs Immature Granulocytes: 0.05 K/uL (ref 0.00–0.07)
Basophils Absolute: 0 K/uL (ref 0.0–0.1)
Basophils Relative: 0 %
Eosinophils Absolute: 0.1 K/uL (ref 0.0–0.5)
Eosinophils Relative: 1 %
HCT: 41.8 % (ref 36.0–46.0)
Hemoglobin: 13.2 g/dL (ref 12.0–15.0)
Immature Granulocytes: 0 %
Lymphocytes Relative: 9 %
Lymphs Abs: 1 K/uL (ref 0.7–4.0)
MCH: 24.6 pg — ABNORMAL LOW (ref 26.0–34.0)
MCHC: 31.6 g/dL (ref 30.0–36.0)
MCV: 78 fL — ABNORMAL LOW (ref 80.0–100.0)
Monocytes Absolute: 0.8 K/uL (ref 0.1–1.0)
Monocytes Relative: 7 %
Neutro Abs: 9.9 K/uL — ABNORMAL HIGH (ref 1.7–7.7)
Neutrophils Relative %: 83 %
Platelets: 207 K/uL (ref 150–400)
RBC: 5.36 MIL/uL — ABNORMAL HIGH (ref 3.87–5.11)
RDW: 15.3 % (ref 11.5–15.5)
WBC: 12 K/uL — ABNORMAL HIGH (ref 4.0–10.5)
nRBC: 0 % (ref 0.0–0.2)

## 2024-07-08 LAB — BASIC METABOLIC PANEL WITH GFR
Anion gap: 12 (ref 5–15)
BUN: 11 mg/dL (ref 6–20)
CO2: 27 mmol/L (ref 22–32)
Calcium: 9.4 mg/dL (ref 8.9–10.3)
Chloride: 97 mmol/L — ABNORMAL LOW (ref 98–111)
Creatinine, Ser: 0.71 mg/dL (ref 0.44–1.00)
GFR, Estimated: >60 mL/min (ref >60)
Glucose, Bld: 133 mg/dL — ABNORMAL HIGH (ref 70–99)
Potassium: 3.2 mmol/L — ABNORMAL LOW (ref 3.5–5.1)
Sodium: 137 mmol/L (ref 135–145)

## 2024-07-08 LAB — RESP PANEL BY RT-PCR (RSV, FLU A&B, COVID)  RVPGX2
Influenza A by PCR: NEGATIVE
Influenza B by PCR: NEGATIVE
Resp Syncytial Virus by PCR: NEGATIVE
SARS Coronavirus 2 by RT PCR: NEGATIVE

## 2024-07-08 MED ORDER — MAGNESIUM SULFATE 2 GM/50ML IV SOLN
2.0000 g | Freq: Once | INTRAVENOUS | Status: AC
  Administered 2024-07-08: 2 g via INTRAVENOUS
  Filled 2024-07-08: qty 50

## 2024-07-08 MED ORDER — DEXAMETHASONE SOD PHOSPHATE PF 10 MG/ML IJ SOLN
10.0000 mg | Freq: Once | INTRAMUSCULAR | Status: AC
  Administered 2024-07-08: 10 mg via INTRAVENOUS

## 2024-07-08 MED ORDER — MORPHINE SULFATE (PF) 4 MG/ML IV SOLN
4.0000 mg | Freq: Once | INTRAVENOUS | Status: DC
  Filled 2024-07-08: qty 1

## 2024-07-08 MED ORDER — METOCLOPRAMIDE HCL 5 MG/ML IJ SOLN
10.0000 mg | Freq: Once | INTRAMUSCULAR | Status: AC
  Administered 2024-07-08: 10 mg via INTRAVENOUS
  Filled 2024-07-08: qty 2

## 2024-07-08 MED ORDER — DIPHENHYDRAMINE HCL 50 MG/ML IJ SOLN
25.0000 mg | Freq: Once | INTRAMUSCULAR | Status: AC
  Administered 2024-07-08: 25 mg via INTRAVENOUS
  Filled 2024-07-08: qty 1

## 2024-07-08 MED ORDER — POTASSIUM CHLORIDE CRYS ER 20 MEQ PO TBCR
40.0000 meq | EXTENDED_RELEASE_TABLET | Freq: Once | ORAL | Status: AC
  Administered 2024-07-08: 40 meq via ORAL
  Filled 2024-07-08: qty 2

## 2024-07-08 MED ORDER — KETOROLAC TROMETHAMINE 30 MG/ML IJ SOLN
30.0000 mg | Freq: Once | INTRAMUSCULAR | Status: AC
  Administered 2024-07-08: 30 mg via INTRAVENOUS
  Filled 2024-07-08: qty 1

## 2024-07-08 NOTE — ED Provider Notes (Signed)
 [place] EMERGENCY DEPARTMENT AT [facility] Provider Note   CSN: [id number] Arrival date & time: 07/08/24  9491     Patient presents with: Headache   Deborah Garza is a 43 y.o. female.   Patient is a 43 year old female with history of hypertension, kidney stones, anemia presenting with complaints of headache.  She describes feeling chilled at home, now started with pain to the right side of her head.  No sore throat or cough.  No ill contacts.  She has taken Tylenol  and ibuprofen  with little relief.       Prior to Admission medications  Medication Sig Start Date End Date Taking? Authorizing Provider  acetaminophen  (TYLENOL ) 500 MG tablet Take 1,000 mg by mouth every 6 (six) hours as needed for moderate pain or headache.    [provider]  atorvastatin (LIPITOR) 10 MG tablet Take 10 mg by mouth at bedtime. 03/22/19   [provider]  cyclobenzaprine  (FLEXERIL ) 10 MG tablet Take 1 tablet (10 mg total) by mouth every 8 (eight) hours as needed for muscle spasms. 09/11/20   Gretta Bertrum ORN, PA-C  EPINEPHrine  0.3 mg/0.3 mL IJ SOAJ injection Inject 0.3 mLs (0.3 mg total) into the muscle once as needed for up to 1 dose for anaphylaxis. 11/01/19   Bobbitt, Elgin Pepper, MD  Fluticasone Propionate  (XHANCE ) 93 MCG/ACT EXHU Place 2 sprays into the nose 2 (two) times daily as needed. Patient taking differently: Place 2 sprays into the nose 2 (two) times daily as needed (allergies).  01/03/20   Bobbitt, Elgin Pepper, MD  hydrochlorothiazide  (HYDRODIURIL ) 25 MG tablet Take 25 mg by mouth at bedtime.     [provider]  HYDROcodone -acetaminophen  (NORCO) 5-325 MG tablet Take 1-2 tablets by mouth every 4 (four) hours as needed for moderate pain. 09/11/20   Gretta Bertrum ORN, PA-C  ibuprofen  (ADVIL ) 800 MG tablet Take 800 mg by mouth 2 (two) times daily as needed for moderate pain.     [provider]  levocetirizine (XYZAL ) 5 MG tablet TAKE 1 TABLET(5 MG) BY  MOUTH EVERY EVENING 11/22/20   Ambs, Anne M, FNP  metFORMIN (GLUCOPHAGE-XR) 500 MG 24 hr tablet Take 500 mg by mouth at bedtime.  03/22/19   [provider]  NIFEdipine  (ADALAT  CC) 30 MG 24 hr tablet Take 30 mg by mouth at bedtime.  08/13/18   [provider]  Olopatadine  HCl (PATADAY ) 0.2 % SOLN Place 1 drop into both eyes 1 day or 1 dose. Patient not taking: Reported on 01/18/2020 10/04/19   Bobbitt, Elgin Pepper, MD  PARoxetine  (PAXIL ) 10 MG tablet Take 10 mg by mouth at bedtime.  08/30/15   [provider]  SAXENDA 18 MG/3ML SOPN Inject 3 mg into the skin daily. 12/17/19   [provider]    Allergies: Aspirin    Review of Systems  All other systems reviewed and are negative.   Updated Vital Signs Temp 99.6 F (37.6 C) (Oral)   Resp 20   Wt 122.5 kg   LMP 08/03/2018 Comment: Spotting  BMI 44.94 kg/m   Physical Exam Vitals and nursing note reviewed.  Constitutional:      General: She is not in acute distress.    Appearance: She is well-developed. She is not diaphoretic.  HENT:     Head: Normocephalic and atraumatic.  Eyes:     Extraocular Movements: Extraocular movements intact.     Pupils: Pupils are equal, round, and reactive to light.  Cardiovascular:  Rate and Rhythm: Normal rate and regular rhythm.     Heart sounds: No murmur heard.    No friction rub. No gallop.  Pulmonary:     Effort: Pulmonary effort is normal. No respiratory distress.     Breath sounds: Normal breath sounds. No wheezing.  Abdominal:     General: Bowel sounds are normal. There is no distension.     Palpations: Abdomen is soft.     Tenderness: There is no abdominal tenderness.  Musculoskeletal:        General: Normal range of motion.     Cervical back: Normal range of motion and neck supple.  Skin:    General: Skin is warm and dry.  Neurological:     General: No focal deficit present.     Mental Status: She is alert and oriented to person, place, and time.      Cranial Nerves: No cranial nerve deficit.     Sensory: No sensory deficit.     Motor: No weakness.     (all labs ordered are listed, but only abnormal results are displayed) Labs Reviewed  RESP PANEL BY RT-PCR (RSV, FLU A&B, COVID)  [id number]    EKG: None  Radiology: No results found.   Procedures   Medications Ordered in the ED - No data to display                                  Medical Decision Making Amount and/or Complexity of Data Reviewed Labs: ordered. Radiology: ordered.  Risk Prescription drug management.   Patient is a 43 year old female presenting with complaints of headache.  Symptoms began earlier today.  Patient arrives here with stable vital signs and is afebrile.  Physical examination is unremarkable and patient is neurologically intact.  Laboratory studies obtained including respiratory panel which was negative for COVID/flu/RSV.  CBC and basic metabolic panel currently pending.  Patient to undergo a head CT to further evaluate the cause of her headache.  Migraine cocktail has been ordered with care being signed out to oncoming provider at shift change.     Final diagnoses:  None    ED Discharge Orders     None          Geroldine Berg, MD 07/08/24 [phone]

## 2024-07-08 NOTE — ED Provider Notes (Signed)
 Pt signed out by Dr. Geroldine pending labs, CT, and improvement in sx.  CT scan reviewed by me.  I agree with the radiologist.  CT head: No acute intracranial abnormality.   Covid/flu/rsv neg CBC with wbc sl elevated at 12 BMP with k sl low at 3.2  Pt's headache has improved, but remains a 7.  She is given some morphine.  Pt also given kdur and magnesium.   Pt is feeling much better now and is ready to go home.  Return if worse.  F/u with pcp.    Dean Clarity, MD 07/08/24 [phone]

## 2024-07-08 NOTE — ED Triage Notes (Signed)
 Pt in with R sided HA and low grade fever, onset yesterday.

## 2024-07-08 NOTE — ED Notes (Signed)
 Pt refuses continuous monitoring of pulse ox/BP d/t 'makes my arm tingle'

## 2024-07-08 NOTE — ED Notes (Signed)
 DC paperwork given and verbally understood.

## 2024-07-08 NOTE — ED Notes (Signed)
 Pt can eat per EDP
